# Patient Record
Sex: Male | Born: 1995 | Race: White | Hispanic: No | Marital: Single | Smoking: Never smoker
Health system: Southern US, Community
[De-identification: ages and names within clinical notes are randomized; demographics above are authoritative.]

## PROBLEM LIST (undated history)

## (undated) DIAGNOSIS — IMO0002 Reserved for concepts with insufficient information to code with codable children: Secondary | ICD-10-CM

## (undated) DIAGNOSIS — R202 Paresthesia of skin: Secondary | ICD-10-CM

## (undated) DIAGNOSIS — G5603 Carpal tunnel syndrome, bilateral upper limbs: Secondary | ICD-10-CM

## (undated) DIAGNOSIS — E1065 Type 1 diabetes mellitus with hyperglycemia: Secondary | ICD-10-CM

## (undated) HISTORY — DX: Type 1 diabetes mellitus with hyperglycemia: E10.65

## (undated) HISTORY — DX: Paresthesia of skin: R20.2

## (undated) HISTORY — DX: Carpal tunnel syndrome, bilateral upper limbs: G56.03

## (undated) HISTORY — DX: Reserved for concepts with insufficient information to code with codable children: IMO0002

---
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
## 2017-03-01 DIAGNOSIS — Z794 Long term (current) use of insulin: Secondary | ICD-10-CM | POA: Diagnosis not present

## 2017-03-01 DIAGNOSIS — Z9641 Presence of insulin pump (external) (internal): Secondary | ICD-10-CM | POA: Diagnosis not present

## 2017-03-01 DIAGNOSIS — E109 Type 1 diabetes mellitus without complications: Secondary | ICD-10-CM | POA: Diagnosis not present

## 2017-03-17 ENCOUNTER — Other Ambulatory Visit: Payer: Self-pay | Admitting: Endocrinology

## 2017-03-17 NOTE — Telephone Encounter (Signed)
Please refill x 1 Ov is due  

## 2017-03-22 DIAGNOSIS — E109 Type 1 diabetes mellitus without complications: Secondary | ICD-10-CM | POA: Diagnosis not present

## 2017-04-19 DIAGNOSIS — E109 Type 1 diabetes mellitus without complications: Secondary | ICD-10-CM | POA: Diagnosis not present

## 2017-04-19 DIAGNOSIS — Z9641 Presence of insulin pump (external) (internal): Secondary | ICD-10-CM | POA: Diagnosis not present

## 2017-04-19 DIAGNOSIS — Z794 Long term (current) use of insulin: Secondary | ICD-10-CM | POA: Diagnosis not present

## 2017-05-30 DIAGNOSIS — Z9641 Presence of insulin pump (external) (internal): Secondary | ICD-10-CM | POA: Diagnosis not present

## 2017-05-30 DIAGNOSIS — Z794 Long term (current) use of insulin: Secondary | ICD-10-CM | POA: Diagnosis not present

## 2017-05-30 DIAGNOSIS — E109 Type 1 diabetes mellitus without complications: Secondary | ICD-10-CM | POA: Diagnosis not present

## 2017-07-21 DIAGNOSIS — E109 Type 1 diabetes mellitus without complications: Secondary | ICD-10-CM | POA: Diagnosis not present

## 2017-07-21 DIAGNOSIS — Z794 Long term (current) use of insulin: Secondary | ICD-10-CM | POA: Diagnosis not present

## 2017-07-21 DIAGNOSIS — Z9641 Presence of insulin pump (external) (internal): Secondary | ICD-10-CM | POA: Diagnosis not present

## 2017-08-01 ENCOUNTER — Encounter: Payer: Self-pay | Admitting: Endocrinology

## 2017-08-01 ENCOUNTER — Ambulatory Visit: Payer: BLUE CROSS/BLUE SHIELD | Admitting: Endocrinology

## 2017-08-01 VITALS — BP 122/78 | HR 99 | Wt 245.4 lb

## 2017-08-01 DIAGNOSIS — E1065 Type 1 diabetes mellitus with hyperglycemia: Secondary | ICD-10-CM | POA: Diagnosis not present

## 2017-08-01 LAB — POCT GLYCOSYLATED HEMOGLOBIN (HGB A1C): HEMOGLOBIN A1C: 7.3

## 2017-08-01 NOTE — Patient Instructions (Addendum)
check your blood sugar 6 times a day: before the 3 meals, and at bedtime.  also check if you have symptoms of your blood sugar being too high or too low.  please keep a record of the readings and bring it to your next appointment here (or you can bring the meter itself).  You can write it on any piece of paper.  please call us sooner if your blood sugar goes below 70, or if you have a lot of readings over 200. Please take these settings:  basal rate of 1.7 units/hr, midnight-6AM, and 3.5 units/hr all other times.  mealtime bolus (which you take only at supper): 1 unit/ 10 grams carbohydrate.  correction bolus (which some people call "sensitivity," or "insulin sensitivity ratio," or just "isr") of 1 unit for each 50 by which your glucose exceeds 100.  Suspend the pump x 1-2 hrs, for exercise.   For prolonged (> 2 hrs) exercise, reduce basal rate by 1/2.  Please come back for a follow-up appointment in 3 months.

## 2017-08-01 NOTE — Progress Notes (Signed)
Subjective:    Patient ID: Sean Fuentes, male    DOB: 09-08-1995, 22 y.o.   MRN: [id number]  HPI Pt returns for f/u of diabetes mellitus:  DM type: 1  Dx'ed: 2002  Complications: retinopathy.  Therapy: insulin since dx  DKA: 2010, which pt attributes to a pump malfunction.  Severe hypoglycemia: never Pancreatitis: never Other: pump rx since 2004; he now uses an omnipod.  He has a Dexcom G-5.  He goes to [facility]; therapy limited by noncompliance with cbg monitoring and taking boluses, so the plan has been to emphasize the basal rate Interval history:  He takes these settings:  basal rate of 1.7 units/hr, midnight-6AM, and 3.5 units/hr all other times.  mealtime bolus (which you take only at supper): 1 unit/ 10 grams carbohydrate.  correction bolus (which some people call "sensitivity," or "insulin sensitivity ratio," or just "isr") of 1 unit for each 50 by which your glucose exceeds 100.  Suspend the pump x 1-2 hrs, for exercise (however, he does not exercise recently).   For prolonged (> 2 hrs) exercise, reduces basal rate by 1/2.  Total daily dosage is approx 75 units He hasn't recently used continuous glucose monitor. he has hypoglycemia 1-2 times daily.  This happens at any time of day. It is highest at HS (200).  He says he eats less during the summer, when he is not at school.  Meter is downloaded today, and the printout is scanned into the record.  There aren't enough cbg's to draw any conclusions about trends. Past Medical History:  Diagnosis Date  . Bilateral carpal tunnel syndrome 01/15/2016  . Paresthesia 12/25/2015  . Type I (juvenile type) diabetes mellitus without mention of complication, uncontrolled     History reviewed. No pertinent surgical history.  Social History   Socioeconomic History  . Marital status: Single    Spouse name: Not on file  . Number of children: 0  . Years of education: 42  . Highest education level: Not on file  Social Needs  . Financial  resource strain: Not on file  . Food insecurity - worry: Not on file  . Food insecurity - inability: Not on file  . Transportation needs - medical: Not on file  . Transportation needs - non-medical: Not on file  Occupational History  . Occupation: Restaurant  Tobacco Use  . Smoking status: Never Smoker  . Smokeless tobacco: Never Used  Substance and Sexual Activity  . Alcohol use: No  . Drug use: No  . Sexual activity: Not on file  Other Topics Concern  . Not on file  Social History Narrative   Lives at home w/ mom and sister   Right-handed   Reports 600 mg caffeine per day    Current Outpatient Medications on File Prior to Visit  Medication Sig Dispense Refill  . glucagon (GLUCAGON EMERGENCY) 1 MG injection Inject 1 mg into the vein once as needed. 4 each 2  . Glucose Blood (BLOOD GLUCOSE TEST STRIPS) STRP Use to check blood sugar 6 times per day. Please verify brand of test strips with pt. 540 each 2  . insulin aspart (NOVOLOG FLEXPEN) 100 UNIT/ML FlexPen 1 unit per 15 gms of carbs, 3 times a day (just before each meal) 15 mL 11  . Insulin Disposable Pump (OMNIPOD) MISC 1 Device by Does not apply route every 3 (three) days.    . Insulin Glargine (LANTUS SOLOSTAR) 100 UNIT/ML Solostar Pen Inject 70 Units into the skin every  morning. And pen needles 4/day 10 pen PRN  . NOVOLOG 100 UNIT/ML injection FOR USE IN PUMP, A TOTAL OF 100 UNITS/DAY. 110 mL 0   Current Facility-Administered Medications on File Prior to Visit  Medication Dose Route Frequency Provider Last Rate Last Dose  . gadopentetate dimeglumine (MAGNEVIST) injection 20 mL  20 mL Intravenous Once PRN York SpanielWillis, Charles K, MD        Not on File  Family History  Problem Relation Age of Onset  . Hypothyroidism Father   . Hypertension Father   . High Cholesterol Father   . Seizures Father   . Hypothyroidism Mother   . Diabetes Neg Hx     BP 122/78 (BP Location: Left Arm, Patient Position: Sitting, Cuff Size: Normal)    Pulse 99   Wt 245 lb 6.4 oz (111.3 kg)   SpO2 97%   BMI 33.28 kg/m    Review of Systems Denies LOC    Objective:   Physical Exam VITAL SIGNS:  See vs page GENERAL: no distress Pulses: foot pulses are intact bilaterally.   MSK: no deformity of the feet or ankles.  CV: no edema of the legs or ankles Skin:  no ulcer on the feet or ankles.  normal color and temp on the feet and ankles Neuro: sensation is intact to touch on the feet and ankles.    A1c=7.3%     Assessment & Plan:  Type 1 DM: worse. Noncompliance with cbg recording: there aren't enough cbg's to draw conclusions.  Hypoglycemia: this limits aggressiveness of glycemic control.   Patient Instructions  check your blood sugar 6 times a day: before the 3 meals, and at bedtime.  also check if you have symptoms of your blood sugar being too high or too low.  please keep a record of the readings and bring it to your next appointment here (or you can bring the meter itself).  You can write it on any piece of paper.  please call us sooner if your blood sugar goes below 70, or if you have a lot of readings over 200. Please take these settings:  basal rate of 1.7 units/hr, midnight-6AM, and 3.5 units/hr all other times.  mealtime bolus (which you take only at supper): 1 unit/ 10 grams carbohydrate.  correction bolus (which some people call "sensitivity," or "insulin sensitivity ratio," or just "isr") of 1 unit for each 50 by which your glucose exceeds 100.  Suspend the pump x 1-2 hrs, for exercise.   For prolonged (> 2 hrs) exercise, reduce basal rate by 1/2.  Please come back for a follow-up appointment in 3 months.

## 2017-08-04 ENCOUNTER — Telehealth: Payer: Self-pay | Admitting: Endocrinology

## 2017-08-04 ENCOUNTER — Other Ambulatory Visit: Payer: Self-pay

## 2017-08-04 MED ORDER — INSULIN ASPART 100 UNIT/ML ~~LOC~~ SOLN: SUBCUTANEOUS | 0 refills | Status: DC

## 2017-08-04 NOTE — Telephone Encounter (Signed)
I have sent to pharmacy requested.

## 2017-08-04 NOTE — Telephone Encounter (Signed)
Pt called in for a refill of the NOVOLOG vials for pump use. He originally called the pharmacy to have it transferred to new pharmacy and get his refill. He was told that it couldn't be transferred because there where no more refills. Pt would like the refill sent to the new pharmacy listed below.  ([facility]: [address], [place], [place])

## 2017-10-29 ENCOUNTER — Other Ambulatory Visit: Payer: Self-pay | Admitting: Endocrinology

## 2017-11-10 DIAGNOSIS — Z23 Encounter for immunization: Secondary | ICD-10-CM | POA: Diagnosis not present

## 2018-01-16 DIAGNOSIS — E109 Type 1 diabetes mellitus without complications: Secondary | ICD-10-CM | POA: Diagnosis not present

## 2018-01-16 DIAGNOSIS — Z9641 Presence of insulin pump (external) (internal): Secondary | ICD-10-CM | POA: Diagnosis not present

## 2018-01-16 DIAGNOSIS — Z794 Long term (current) use of insulin: Secondary | ICD-10-CM | POA: Diagnosis not present

## 2018-02-26 ENCOUNTER — Other Ambulatory Visit: Payer: Self-pay | Admitting: Endocrinology

## 2018-02-26 NOTE — Telephone Encounter (Signed)
Last ov 08/01/17 ok to refill?

## 2018-02-26 NOTE — Telephone Encounter (Signed)
Please refill x 1 Ov is due  

## 2018-03-13 ENCOUNTER — Encounter: Payer: Self-pay | Admitting: Endocrinology

## 2018-03-13 ENCOUNTER — Ambulatory Visit: Payer: BLUE CROSS/BLUE SHIELD | Admitting: Endocrinology

## 2018-03-13 VITALS — BP 126/80 | HR 80 | Ht 72.0 in | Wt 248.2 lb

## 2018-03-13 DIAGNOSIS — Z23 Encounter for immunization: Secondary | ICD-10-CM | POA: Diagnosis not present

## 2018-03-13 DIAGNOSIS — E1065 Type 1 diabetes mellitus with hyperglycemia: Secondary | ICD-10-CM

## 2018-03-13 LAB — POCT GLYCOSYLATED HEMOGLOBIN (HGB A1C): HEMOGLOBIN A1C: 7.9 % — AB (ref 4.0–5.6)

## 2018-03-13 NOTE — Patient Instructions (Addendum)
check your blood sugar 6 times a day: before the 3 meals, and at bedtime.  also check if you have symptoms of your blood sugar being too high or too low.  please keep a record of the readings and bring it to your next appointment here (or you can bring the meter itself).  You can write it on any piece of paper.  please call us sooner if your blood sugar goes below 70, or if you have a lot of readings over 200. Please take these settings:  basal rate of 1.6 units/hr, midnight-6AM, and 3.6 units/hr all other times.  mealtime bolus (which you take only at supper): 1 unit/ 10 grams carbohydrate.  correction bolus (which some people call "sensitivity," or "insulin sensitivity ratio," or just "isr") of 1 unit for each 50 by which your glucose exceeds 100.  Suspend the pump x 1-2 hrs, for exercise.   For prolonged (> 2 hrs) exercise, reduce basal rate by 1/2.  Please come back for a follow-up appointment in 2 months.

## 2018-03-13 NOTE — Progress Notes (Signed)
Subjective:    Patient ID: Sean Fuentes, male    DOB: [birth year], 22 y.o.   MRN: [id number]  HPI Pt returns for f/u of diabetes mellitus:  DM type: 1  Dx'ed: 2002  Complications: retinopathy.  Therapy: insulin since dx  DKA: 2010, which pt attributes to a pump malfunction.  Severe hypoglycemia: never Pancreatitis: never Other: pump rx since 2004; he now uses an omnipod, and Dexcom G-5.  He goes to [facility]; therapy limited by missing cbg monitoring and boluses, so the plan has been to emphasize the basal rate.   Interval history:  He takes these settings:  Pt says he takes just 1 bolus per day.  He takes these settings: basal rate of 1.7 units/hr, midnight-6AM, and 3.5 units/hr all other times.  mealtime bolus (which you take only at supper): 1 unit/ 10 grams carbohydrate.  correction bolus (which some people call "sensitivity," or "insulin sensitivity ratio," or just "isr") of 1 unit for each 50 by which your glucose exceeds 100.  Suspend the pump x 1-2 hrs, for exercise.   For prolonged (> 2 hrs) exercise, reduce basal rate by 1/2.   Total daily dosage is approx 80 units.  He hasn't recently used continuous glucose monitor. he has hypoglycemia 1-2 times daily.  This happens at any time of day. It is highest at HS (200).  He says he eats less during the summer, when he is not at school.  Meter is downloaded today, and the printout is scanned into the record.  There aren't enough cbg's to draw any conclusions about trends. He hasn't recently used continuous glucose monitor.  This happens at any time of day. It is highest at HS (200).  He says he eats less during the summer, when he is not at school.  Meter is downloaded today, and the printout is scanned into the record.  All are checked 7PM-11PM.  It varies from 120-390.  He often has mild hypoglycemia--this happens in the middle of the night.   Past Medical History:  Diagnosis Date  . Bilateral carpal tunnel syndrome 01/15/2016  .  Paresthesia 12/25/2015  . Type I (juvenile type) diabetes mellitus without mention of complication, uncontrolled     No past surgical history on file.  Social History   Socioeconomic History  . Marital status: Single    Spouse name: Not on file  . Number of children: 0  . Years of education: 19  . Highest education level: Not on file  Occupational History  . Occupation: Higher education careers adviser  . Financial resource strain: Not on file  . Food insecurity:    Worry: Not on file    Inability: Not on file  . Transportation needs:    Medical: Not on file    Non-medical: Not on file  Tobacco Use  . Smoking status: Never Smoker  . Smokeless tobacco: Never Used  Substance and Sexual Activity  . Alcohol use: No  . Drug use: No  . Sexual activity: Not on file  Lifestyle  . Physical activity:    Days per week: Not on file    Minutes per session: Not on file  . Stress: Not on file  Relationships  . Social connections:    Talks on phone: Not on file    Gets together: Not on file    Attends religious service: Not on file    Active member of club or organization: Not on file    Attends meetings of clubs  or organizations: Not on file    Relationship status: Not on file  . Intimate partner violence:    Fear of current or ex partner: Not on file    Emotionally abused: Not on file    Physically abused: Not on file    Forced sexual activity: Not on file  Other Topics Concern  . Not on file  Social History Narrative   Lives at home w/ mom and sister   Right-handed   Reports 600 mg caffeine per day    Current Outpatient Medications on File Prior to Visit  Medication Sig Dispense Refill  . glucagon (GLUCAGON EMERGENCY) 1 MG injection Inject 1 mg into the vein once as needed. 4 each 2  . Glucose Blood (BLOOD GLUCOSE TEST STRIPS) STRP Use to check blood sugar 6 times per day. Please verify brand of test strips with pt. 540 each 2  . insulin aspart (NOVOLOG FLEXPEN) 100 UNIT/ML  FlexPen 1 unit per 15 gms of carbs, 3 times a day (just before each meal) 15 mL 11  . insulin aspart (NOVOLOG) 100 UNIT/ML injection 1u per 15G of carbs tid ac 15 mL 0  . Insulin Disposable Pump (OMNIPOD) MISC 1 Device by Does not apply route every 3 (three) days.    . Insulin Glargine (LANTUS SOLOSTAR) 100 UNIT/ML Solostar Pen Inject 70 Units into the skin every morning. And pen needles 4/day 10 pen PRN   Current Facility-Administered Medications on File Prior to Visit  Medication Dose Route Frequency Provider Last Rate Last Dose  . gadopentetate dimeglumine (MAGNEVIST) injection 20 mL  20 mL Intravenous Once PRN York Spaniel, MD        No Known Allergies  Family History  Problem Relation Age of Onset  . Hypothyroidism Father   . Hypertension Father   . High Cholesterol Father   . Seizures Father   . Hypothyroidism Mother   . Diabetes Neg Hx     BP 126/80   Pulse 80   Ht 6' (1.829 m)   Wt 248 lb 3.2 oz (112.6 kg)   SpO2 98%   BMI 33.66 kg/m    Review of Systems Denies LOC    Objective:   Physical Exam VITAL SIGNS:  See vs page GENERAL: no distress Pulses: foot pulses are intact bilaterally.   MSK: no deformity of the feet or ankles.  CV: no edema of the legs or ankles Skin:  no ulcer on the feet or ankles.  normal color and temp on the feet and ankles Neuro: sensation is intact to touch on the feet and ankles.      Lab Results  Component Value Date   HGBA1C 7.9 (A) 03/13/2018       Assessment & Plan:  Type 1 DM: he needs increased rx   Patient Instructions  check your blood sugar 6 times a day: before the 3 meals, and at bedtime.  also check if you have symptoms of your blood sugar being too high or too low.  please keep a record of the readings and bring it to your next appointment here (or you can bring the meter itself).  You can write it on any piece of paper.  please call us sooner if your blood sugar goes below 70, or if you have a lot of readings  over 200. Please take these settings:  basal rate of 1.6 units/hr, midnight-6AM, and 3.6 units/hr all other times.  mealtime bolus (which you take only at supper): 1  unit/ 10 grams carbohydrate.  correction bolus (which some people call "sensitivity," or "insulin sensitivity ratio," or just "isr") of 1 unit for each 50 by which your glucose exceeds 100.  Suspend the pump x 1-2 hrs, for exercise.   For prolonged (> 2 hrs) exercise, reduce basal rate by 1/2.  Please come back for a follow-up appointment in 2 months.

## 2018-03-16 ENCOUNTER — Other Ambulatory Visit: Payer: Self-pay | Admitting: Endocrinology

## 2018-03-30 ENCOUNTER — Other Ambulatory Visit: Payer: Self-pay | Admitting: Endocrinology

## 2018-04-02 DIAGNOSIS — Z9641 Presence of insulin pump (external) (internal): Secondary | ICD-10-CM | POA: Diagnosis not present

## 2018-04-02 DIAGNOSIS — Z794 Long term (current) use of insulin: Secondary | ICD-10-CM | POA: Diagnosis not present

## 2018-04-02 DIAGNOSIS — E109 Type 1 diabetes mellitus without complications: Secondary | ICD-10-CM | POA: Diagnosis not present

## 2018-04-24 DIAGNOSIS — Z794 Long term (current) use of insulin: Secondary | ICD-10-CM | POA: Diagnosis not present

## 2018-04-24 DIAGNOSIS — Z9641 Presence of insulin pump (external) (internal): Secondary | ICD-10-CM | POA: Diagnosis not present

## 2018-04-24 DIAGNOSIS — E109 Type 1 diabetes mellitus without complications: Secondary | ICD-10-CM | POA: Diagnosis not present

## 2018-05-28 ENCOUNTER — Encounter: Payer: Self-pay | Admitting: Endocrinology

## 2018-05-28 ENCOUNTER — Ambulatory Visit: Payer: BLUE CROSS/BLUE SHIELD | Admitting: Endocrinology

## 2018-05-28 VITALS — BP 126/60 | HR 88 | Ht 72.0 in | Wt 257.0 lb

## 2018-05-28 DIAGNOSIS — E1065 Type 1 diabetes mellitus with hyperglycemia: Secondary | ICD-10-CM

## 2018-05-28 LAB — POCT GLYCOSYLATED HEMOGLOBIN (HGB A1C): Hemoglobin A1C: 7.1 % — AB (ref 4.0–5.6)

## 2018-05-28 NOTE — Patient Instructions (Addendum)
check your blood sugar 6 times a day: before the 3 meals, and at bedtime.  also check if you have symptoms of your blood sugar being too high or too low.  please keep a record of the readings and bring it to your next appointment here (or you can bring the meter itself).  You can write it on any piece of paper.  please call us sooner if your blood sugar goes below 70, or if you have a lot of readings over 200. Please take these settings:  basal rate of 1.6 units/hr, midnight-6AM, and 3.6 units/hr all other times.  mealtime bolus (which you take only at supper): 1 unit/ 10 grams carbohydrate.   correction bolus (which some people call "sensitivity," or "insulin sensitivity ratio," or just "isr") of 1 unit for each 50 by which your glucose exceeds 100.  Suspend the pump x 1-2 hrs, for exercise.   For prolonged (> 2 hrs) exercise, reduce basal rate by 1/2.  Please come back for a follow-up appointment in 3 months.       

## 2018-05-28 NOTE — Progress Notes (Signed)
Subjective:    Patient ID: Sean Fuentes, Sean Fuentes    DOB: 1996/03/19, 22 y.o.   MRN: [id number]  HPI Pt returns for f/u of diabetes mellitus:  DM type: 1  Dx'ed: 2002  Complications: retinopathy.  Therapy: insulin since dx  DKA: 2010, which pt attributes to a pump malfunction.  Severe hypoglycemia: never Pancreatitis: never Other: pump rx since 2004; he now uses an omnipod pump.  He stopped using his Dexcom G-5.  He goes to [facility]; therapy limited by missing cbg monitoring and boluses, so the plan has been to emphasize the basal rate.   Interval history:  He takes these settings:  basal rate of 1.6 units/hr, midnight-6AM, and 3.6 units/hr all other times.  mealtime bolus (which you take only at supper): 1 unit/ 10 grams carbohydrate.  correction bolus (which some people call "sensitivity," or "insulin sensitivity ratio," or just "isr") of 1 unit for each 50 by which your glucose exceeds 100.  Suspends the pump x 1-2 hrs, for exercise.   For prolonged (> 2 hrs) exercise, reduces basal rate by 1/2.  Total daily dosage is approx 100 units.  He has mild hypoglycemia approx QOD--this happens in the late morning no cbg record, but states cbg's are highest at HS.  Pt says he takes just 1-2 boluses per day.  Past Medical History:  Diagnosis Date  . Bilateral carpal tunnel syndrome 01/15/2016  . Paresthesia 12/25/2015  . Type I (juvenile type) diabetes mellitus without mention of complication, uncontrolled     No past surgical history on file.  Social History   Socioeconomic History  . Marital status: Single    Spouse name: Not on file  . Number of children: 0  . Years of education: 2112  . Highest education level: Not on file  Occupational History  . Occupation: Higher education careers adviserestaurant  Social Needs  . Financial resource strain: Not on file  . Food insecurity:    Worry: Not on file    Inability: Not on file  . Transportation needs:    Medical: Not on file    Non-medical: Not on file    Tobacco Use  . Smoking status: Never Smoker  . Smokeless tobacco: Never Used  Substance and Sexual Activity  . Alcohol use: No  . Drug use: No  . Sexual activity: Not on file  Lifestyle  . Physical activity:    Days per week: Not on file    Minutes per session: Not on file  . Stress: Not on file  Relationships  . Social connections:    Talks on phone: Not on file    Gets together: Not on file    Attends religious service: Not on file    Active member of club or organization: Not on file    Attends meetings of clubs or organizations: Not on file    Relationship status: Not on file  . Intimate partner violence:    Fear of current or ex partner: Not on file    Emotionally abused: Not on file    Physically abused: Not on file    Forced sexual activity: Not on file  Other Topics Concern  . Not on file  Social History Narrative   Lives at home w/ mom and sister   Right-handed   Reports 600 mg caffeine per day    Current Outpatient Medications on File Prior to Visit  Medication Sig Dispense Refill  . glucagon (GLUCAGON EMERGENCY) 1 MG injection Inject 1 mg into the  vein once as needed. 4 each 2  . Glucose Blood (BLOOD GLUCOSE TEST STRIPS) STRP Use to check blood sugar 6 times per day. Please verify brand of test strips with pt. 540 each 2  . insulin aspart (NOVOLOG) 100 UNIT/ML injection FOR USE IN PUMP, A TOTAL OF 100 UNITS/DAY. 90 mL 0  . insulin aspart (NOVOLOG) 100 UNIT/ML injection INJECT 1 UNIT PER 15 GRAMS OF CARBS 3 TIMES DAILY AFTER MEALS 10 mL 0  . Insulin Disposable Pump (OMNIPOD) MISC 1 Device by Does not apply route every 3 (three) days.    . Insulin Glargine (LANTUS SOLOSTAR) 100 UNIT/ML Solostar Pen Inject 70 Units into the skin every morning. And pen needles 4/day 10 pen PRN   Current Facility-Administered Medications on File Prior to Visit  Medication Dose Route Frequency Provider Last Rate Last Dose  . gadopentetate dimeglumine (MAGNEVIST) injection 20 mL  20  mL Intravenous Once PRN York Spaniel, MD        No Known Allergies  Family History  Problem Relation Age of Onset  . Hypothyroidism Father   . Hypertension Father   . High Cholesterol Father   . Seizures Father   . Hypothyroidism Mother   . Diabetes Neg Hx     BP 126/60   Pulse 88   Ht 6' (1.829 m)   Wt 257 lb (116.6 kg)   SpO2 98%   BMI 34.86 kg/m   Review of Systems Denies LOC    Objective:   Physical Exam VITAL SIGNS:  See vs page GENERAL: no distress Pulses: dorsalis pedis intact bilat.   MSK: no deformity of the feet CV: no leg edema Skin:  no ulcer on the feet.  normal color and temp on the feet. Neuro: sensation is intact to touch on the feet.    A1c=7.1%     Assessment & Plan:  Type 1 DM: well-controlled.  Hypoglycemia: this limits agressiveness of glycemic control.   Patient Instructions  check your blood sugar 6 times a day: before the 3 meals, and at bedtime.  also check if you have symptoms of your blood sugar being too high or too low.  please keep a record of the readings and bring it to your next appointment here (or you can bring the meter itself).  You can write it on any piece of paper.  please call us sooner if your blood sugar goes below 70, or if you have a lot of readings over 200. Please take these settings:  basal rate of 1.6 units/hr, midnight-6AM, and 3.6 units/hr all other times.  mealtime bolus (which you take only at supper): 1 unit/ 10 grams carbohydrate.  correction bolus (which some people call "sensitivity," or "insulin sensitivity ratio," or just "isr") of 1 unit for each 50 by which your glucose exceeds 100.  Suspend the pump x 1-2 hrs, for exercise.   For prolonged (> 2 hrs) exercise, reduce basal rate by 1/2.  Please come back for a follow-up appointment in 3 months.

## 2018-06-17 ENCOUNTER — Other Ambulatory Visit: Payer: Self-pay | Admitting: Endocrinology

## 2018-07-26 DIAGNOSIS — E109 Type 1 diabetes mellitus without complications: Secondary | ICD-10-CM | POA: Diagnosis not present

## 2018-07-26 DIAGNOSIS — Z9641 Presence of insulin pump (external) (internal): Secondary | ICD-10-CM | POA: Diagnosis not present

## 2018-07-26 DIAGNOSIS — Z794 Long term (current) use of insulin: Secondary | ICD-10-CM | POA: Diagnosis not present

## 2018-09-05 ENCOUNTER — Telehealth: Payer: Self-pay | Admitting: Endocrinology

## 2018-09-05 NOTE — Telephone Encounter (Signed)
Patient's refill for his Novolog insulin was written so that he only got 1 vial of insulin versus 9 vials as per norm.  Requests that his Rx be written in order for him to get a months worth of insulin -

## 2018-09-06 ENCOUNTER — Other Ambulatory Visit: Payer: Self-pay | Admitting: Endocrinology

## 2018-09-06 MED ORDER — INSULIN ASPART 100 UNIT/ML ~~LOC~~ SOLN: SUBCUTANEOUS | 0 refills | Status: DC

## 2018-09-06 NOTE — Telephone Encounter (Signed)
Refilled Novolog insulin #9 vials sent to the pharmacy

## 2018-10-10 DIAGNOSIS — S61412A Laceration without foreign body of left hand, initial encounter: Secondary | ICD-10-CM | POA: Diagnosis not present

## 2018-10-10 DIAGNOSIS — Z9641 Presence of insulin pump (external) (internal): Secondary | ICD-10-CM | POA: Diagnosis not present

## 2018-10-10 DIAGNOSIS — Z794 Long term (current) use of insulin: Secondary | ICD-10-CM | POA: Diagnosis not present

## 2018-10-10 DIAGNOSIS — F419 Anxiety disorder, unspecified: Secondary | ICD-10-CM | POA: Diagnosis not present

## 2018-10-10 DIAGNOSIS — Z79899 Other long term (current) drug therapy: Secondary | ICD-10-CM | POA: Diagnosis not present

## 2018-10-10 DIAGNOSIS — E109 Type 1 diabetes mellitus without complications: Secondary | ICD-10-CM | POA: Diagnosis not present

## 2018-10-10 DIAGNOSIS — G8911 Acute pain due to trauma: Secondary | ICD-10-CM | POA: Diagnosis not present

## 2018-10-10 DIAGNOSIS — S61012A Laceration without foreign body of left thumb without damage to nail, initial encounter: Secondary | ICD-10-CM | POA: Diagnosis not present

## 2018-10-19 DIAGNOSIS — Z4802 Encounter for removal of sutures: Secondary | ICD-10-CM | POA: Diagnosis not present

## 2018-12-03 ENCOUNTER — Other Ambulatory Visit: Payer: Self-pay | Admitting: Endocrinology

## 2018-12-17 DIAGNOSIS — E109 Type 1 diabetes mellitus without complications: Secondary | ICD-10-CM | POA: Diagnosis not present

## 2018-12-17 DIAGNOSIS — Z794 Long term (current) use of insulin: Secondary | ICD-10-CM | POA: Diagnosis not present

## 2018-12-17 DIAGNOSIS — Z9641 Presence of insulin pump (external) (internal): Secondary | ICD-10-CM | POA: Diagnosis not present

## 2018-12-17 DIAGNOSIS — F419 Anxiety disorder, unspecified: Secondary | ICD-10-CM | POA: Diagnosis not present

## 2018-12-17 DIAGNOSIS — F329 Major depressive disorder, single episode, unspecified: Secondary | ICD-10-CM | POA: Diagnosis not present

## 2018-12-17 DIAGNOSIS — L309 Dermatitis, unspecified: Secondary | ICD-10-CM | POA: Diagnosis not present

## 2018-12-17 DIAGNOSIS — L03116 Cellulitis of left lower limb: Secondary | ICD-10-CM | POA: Diagnosis not present

## 2018-12-17 DIAGNOSIS — L03115 Cellulitis of right lower limb: Secondary | ICD-10-CM | POA: Diagnosis not present

## 2019-01-29 DIAGNOSIS — Z9641 Presence of insulin pump (external) (internal): Secondary | ICD-10-CM | POA: Diagnosis not present

## 2019-01-29 DIAGNOSIS — E109 Type 1 diabetes mellitus without complications: Secondary | ICD-10-CM | POA: Diagnosis not present

## 2019-01-29 DIAGNOSIS — Z794 Long term (current) use of insulin: Secondary | ICD-10-CM | POA: Diagnosis not present

## 2019-01-31 ENCOUNTER — Encounter: Payer: Self-pay | Admitting: Endocrinology

## 2019-01-31 NOTE — Telephone Encounter (Signed)
Please advise 

## 2019-02-05 ENCOUNTER — Other Ambulatory Visit: Payer: Self-pay

## 2019-02-07 ENCOUNTER — Other Ambulatory Visit: Payer: Self-pay

## 2019-02-07 ENCOUNTER — Ambulatory Visit (INDEPENDENT_AMBULATORY_CARE_PROVIDER_SITE_OTHER): Payer: BC Managed Care – PPO | Admitting: Endocrinology

## 2019-02-07 ENCOUNTER — Encounter: Payer: Self-pay | Admitting: Endocrinology

## 2019-02-07 VITALS — BP 142/80 | HR 94 | Ht 72.0 in | Wt 255.2 lb

## 2019-02-07 DIAGNOSIS — E1065 Type 1 diabetes mellitus with hyperglycemia: Secondary | ICD-10-CM

## 2019-02-07 LAB — POCT GLYCOSYLATED HEMOGLOBIN (HGB A1C): Hemoglobin A1C: 7.1 % — AB (ref 4.0–5.6)

## 2019-02-07 MED ORDER — GLUCAGON EMERGENCY 1 MG IJ KIT: 1.0000 mg | PACK | Freq: Once | INTRAMUSCULAR | 2 refills | Status: DC | PRN

## 2019-02-07 MED ORDER — INSULIN ASPART 100 UNIT/ML ~~LOC~~ SOLN: SUBCUTANEOUS | 3 refills | Status: DC

## 2019-02-07 NOTE — Progress Notes (Signed)
Subjective:    Patient ID: Sean Fuentes, male    DOB: 07-15-1995, 23 y.o.   MRN: [id number]  HPI Pt returns for f/u of diabetes mellitus:  DM type: 1  Dx'ed: 9323  Complications: retinopathy.  Therapy: insulin since dx  DKA: 2010, which pt attributes to a pump malfunction.  Severe hypoglycemia: never Pancreatitis: never Other: pump rx since 2004; he now uses an omnipod pump.  He stopped using his Dexcom G-5, as he did not want to wear and additional device.  He goes to [facility]; therapy limited by missing cbg monitoring and boluses, so the plan has been to emphasize the basal rate.   Interval history:  He takes these settings:  basal rate of 1.6 units/hr, midnight-6AM, and 3.6 units/hr all other times.  mealtime bolus (which you take only at supper): 1 unit/ 10 grams carbohydrate.  correction bolus (which some people call "sensitivity," or "insulin sensitivity ratio," or just "isr") of 1 unit for each 50 by which your glucose exceeds 100.  Suspends the pump x 1-2 hrs, for exercise.   For prolonged (> 2 hrs) exercise, reduces basal rate by 1/2.  Total daily dosage is approx 80 units.  He has sxs of mild hypoglycemia approx QD--this happens in the late afternoon. This happens if he eats a minimal lunch.   Pt says he takes just 1 bolus per day.  Meter is downloaded today, and the printout is scanned into the record.  cbg varies from 211-309.  There is no trend throughout the day.  There are only 4 cbg's.   Past Medical History:  Diagnosis Date  . Bilateral carpal tunnel syndrome 01/15/2016  . Paresthesia 12/25/2015  . Type I (juvenile type) diabetes mellitus without mention of complication, uncontrolled     No past surgical history on file.  Social History   Socioeconomic History  . Marital status: Single    Spouse name: Not on file  . Number of children: 0  . Years of education: 65  . Highest education level: Not on file  Occupational History  . Occupation: Ship broker  . Financial resource strain: Not on file  . Food insecurity    Worry: Not on file    Inability: Not on file  . Transportation needs    Medical: Not on file    Non-medical: Not on file  Tobacco Use  . Smoking status: Never Smoker  . Smokeless tobacco: Never Used  Substance and Sexual Activity  . Alcohol use: No  . Drug use: No  . Sexual activity: Not on file  Lifestyle  . Physical activity    Days per week: Not on file    Minutes per session: Not on file  . Stress: Not on file  Relationships  . Social Herbalist on phone: Not on file    Gets together: Not on file    Attends religious service: Not on file    Active member of club or organization: Not on file    Attends meetings of clubs or organizations: Not on file    Relationship status: Not on file  . Intimate partner violence    Fear of current or ex partner: Not on file    Emotionally abused: Not on file    Physically abused: Not on file    Forced sexual activity: Not on file  Other Topics Concern  . Not on file  Social History Narrative   Lives at home w/  mom and sister   Right-handed   Reports 600 mg caffeine per day    Current Outpatient Medications on File Prior to Visit  Medication Sig Dispense Refill  . Glucose Blood (BLOOD GLUCOSE TEST STRIPS) STRP Use to check blood sugar 6 times per day. Please verify brand of test strips with pt. 540 each 2  . Insulin Disposable Pump (OMNIPOD) MISC 1 Device by Does not apply route every 3 (three) days.     Current Facility-Administered Medications on File Prior to Visit  Medication Dose Route Frequency Provider Last Rate Last Dose  . gadopentetate dimeglumine (MAGNEVIST) injection 20 mL  20 mL Intravenous Once PRN York SpanielWillis, Charles K, MD        No Known Allergies  Family History  Problem Relation Age of Onset  . Hypothyroidism Father   . Hypertension Father   . High Cholesterol Father   . Seizures Father   . Hypothyroidism Mother   .  Diabetes Neg Hx     BP (!) 142/80 (BP Location: Right Arm, Patient Position: Sitting, Cuff Size: Large)   Pulse 94   Ht 6' (1.829 m)   Wt 255 lb 3.2 oz (115.8 kg)   SpO2 96%   BMI 34.61 kg/m    Review of Systems Denies LOC.     Objective:   Physical Exam VITAL SIGNS:  See vs page GENERAL: no distress Pulses: dorsalis pedis intact bilat.   MSK: no deformity of the feet CV: no leg edema Skin:  no ulcer on the feet, but there are heavy calluses.  normal color and temp on the feet. Neuro: sensation is intact to touch on the feet   Lab Results  Component Value Date   HGBA1C 7.1 (A) 02/07/2019       Assessment & Plan:  Type 1 DM: well-controlled Hypoglycemia: this limits aggressiveness of glycemic control. Risk of infectious disease: I did letter for pt today.  HTN: is noted today.  Recheck next time   Patient Instructions  check your blood sugar 6 times a day: before the 3 meals, and at bedtime.  also check if you have symptoms of your blood sugar being too high or too low.  please keep a record of the readings and bring it to your next appointment here (or you can bring the meter itself).  You can write it on any piece of paper.  please call us sooner if your blood sugar goes below 70, or if you have a lot of readings over 200. Please take these settings:  basal rate of 1.6 units/hr, midnight-6AM, and 3.6 units/hr all other times.  mealtime bolus (which you take only at supper): 1 unit/ 10 grams carbohydrate.   correction bolus (which some people call "sensitivity," or "insulin sensitivity ratio," or just "isr") of 1 unit for each 50 by which your glucose exceeds 100.  Suspend the pump x 1-2 hrs, for exercise.   For prolonged (> 2 hrs) exercise, reduce basal rate by 1/2.  Please come back for a follow-up appointment in 3 months.

## 2019-02-07 NOTE — Patient Instructions (Addendum)
check your blood sugar 6 times a day: before the 3 meals, and at bedtime.  also check if you have symptoms of your blood sugar being too high or too low.  please keep a record of the readings and bring it to your next appointment here (or you can bring the meter itself).  You can write it on any piece of paper.  please call us sooner if your blood sugar goes below 70, or if you have a lot of readings over 200. Please take these settings:  basal rate of 1.6 units/hr, midnight-6AM, and 3.6 units/hr all other times.  mealtime bolus (which you take only at supper): 1 unit/ 10 grams carbohydrate.   correction bolus (which some people call "sensitivity," or "insulin sensitivity ratio," or just "isr") of 1 unit for each 50 by which your glucose exceeds 100.  Suspend the pump x 1-2 hrs, for exercise.   For prolonged (> 2 hrs) exercise, reduce basal rate by 1/2.  Please come back for a follow-up appointment in 3 months.

## 2019-03-16 DIAGNOSIS — Z794 Long term (current) use of insulin: Secondary | ICD-10-CM | POA: Diagnosis not present

## 2019-03-16 DIAGNOSIS — E109 Type 1 diabetes mellitus without complications: Secondary | ICD-10-CM | POA: Diagnosis not present

## 2019-03-16 DIAGNOSIS — M6282 Rhabdomyolysis: Secondary | ICD-10-CM | POA: Diagnosis not present

## 2019-03-16 DIAGNOSIS — R7989 Other specified abnormal findings of blood chemistry: Secondary | ICD-10-CM | POA: Diagnosis not present

## 2019-03-16 DIAGNOSIS — R945 Abnormal results of liver function studies: Secondary | ICD-10-CM | POA: Diagnosis not present

## 2019-03-19 DIAGNOSIS — R748 Abnormal levels of other serum enzymes: Secondary | ICD-10-CM | POA: Diagnosis not present

## 2019-03-19 DIAGNOSIS — M6282 Rhabdomyolysis: Secondary | ICD-10-CM | POA: Diagnosis not present

## 2019-03-21 DIAGNOSIS — R748 Abnormal levels of other serum enzymes: Secondary | ICD-10-CM | POA: Diagnosis not present

## 2019-03-21 DIAGNOSIS — M6282 Rhabdomyolysis: Secondary | ICD-10-CM | POA: Diagnosis not present

## 2019-03-26 DIAGNOSIS — R748 Abnormal levels of other serum enzymes: Secondary | ICD-10-CM | POA: Diagnosis not present

## 2019-03-26 DIAGNOSIS — T796XXD Traumatic ischemia of muscle, subsequent encounter: Secondary | ICD-10-CM | POA: Diagnosis not present

## 2019-05-31 DIAGNOSIS — Z20828 Contact with and (suspected) exposure to other viral communicable diseases: Secondary | ICD-10-CM | POA: Diagnosis not present

## 2019-06-04 DIAGNOSIS — Z9641 Presence of insulin pump (external) (internal): Secondary | ICD-10-CM | POA: Diagnosis not present

## 2019-06-04 DIAGNOSIS — Z794 Long term (current) use of insulin: Secondary | ICD-10-CM | POA: Diagnosis not present

## 2019-06-04 DIAGNOSIS — E109 Type 1 diabetes mellitus without complications: Secondary | ICD-10-CM | POA: Diagnosis not present

## 2019-08-15 ENCOUNTER — Telehealth: Payer: Self-pay

## 2019-08-15 NOTE — Telephone Encounter (Signed)
Received notification from [employer] stating Rx would need to be completed for pt to TRANSITION to [facility] system. Per Dr. Everardo All, unable to complete Rx forms without an appt. LVM requesting returned call.

## 2019-08-16 ENCOUNTER — Other Ambulatory Visit: Payer: Self-pay

## 2019-08-16 NOTE — Telephone Encounter (Signed)
Patient is scheduled for appointment on 08/19/19

## 2019-08-16 NOTE — Telephone Encounter (Signed)
[employer] documents have been placed on Dr. George Hugh desk for him to address request to TRANSITION to [facility] dash system during 08/19/19 appts.

## 2019-08-19 ENCOUNTER — Ambulatory Visit: Payer: BC Managed Care – PPO | Admitting: Endocrinology

## 2019-08-19 ENCOUNTER — Other Ambulatory Visit: Payer: Self-pay

## 2019-08-19 ENCOUNTER — Encounter: Payer: Self-pay | Admitting: Endocrinology

## 2019-08-19 ENCOUNTER — Telehealth: Payer: Self-pay

## 2019-08-19 VITALS — BP 126/70 | HR 94 | Ht 72.0 in | Wt 269.0 lb

## 2019-08-19 DIAGNOSIS — E1065 Type 1 diabetes mellitus with hyperglycemia: Secondary | ICD-10-CM | POA: Diagnosis not present

## 2019-08-19 LAB — POCT GLYCOSYLATED HEMOGLOBIN (HGB A1C): Hemoglobin A1C: 7.2 % — AB (ref 4.0–5.6)

## 2019-08-19 MED ORDER — OMNIPOD DASH PDM (GEN 4) KIT: 1.0000 | PACK | Freq: Once | 0 refills | Status: AC

## 2019-08-19 MED ORDER — GVOKE HYPOPEN 2-PACK 1 MG/0.2ML ~~LOC~~ SOAJ: 1.0000 | Freq: Once | SUBCUTANEOUS | 3 refills | Status: AC

## 2019-08-19 MED ORDER — OMNIPOD DASH PODS (GEN 4) MISC: 1.0000 | 3 refills | Status: DC

## 2019-08-19 NOTE — Patient Instructions (Addendum)
check your blood sugar 6 times a day: before the 3 meals, and at bedtime.  also check if you have symptoms of your blood sugar being too high or too low.  please keep a record of the readings and bring it to your next appointment here (or you can bring the meter itself).  You can write it on any piece of paper.  please call us sooner if your blood sugar goes below 70, or if you have a lot of readings over 200. Please take these settings:  basal rate of 1.6 units/hr, 10 PM-6AM, and 3.7 units/hr all other times.  mealtime bolus (which you take only at supper): 1 unit/ 10 grams carbohydrate.   correction bolus (which some people call "sensitivity," or "insulin sensitivity ratio," or just "isr") of 1 unit for each 50 by which your glucose exceeds 100.  Suspend the pump x 1-2 hrs, for exercise.   For prolonged (> 2 hrs) exercise, reduce basal rate by 1/2.  On this type of insulin schedule, you should eat meals on a regular schedule.  If a meal is missed or significantly delayed, your blood sugar could go low.   Please come back for a follow-up appointment in 3 months.

## 2019-08-19 NOTE — Telephone Encounter (Signed)
FAXED DOCUMENTS  Company: [employer]  Document: Rx for [employer] Other records requested: None requested  All above requested information has been faxed successfully to [employer] listed above. Documents and fax confirmation have been placed in the faxed file for future reference.

## 2019-08-19 NOTE — Progress Notes (Signed)
Subjective:    Patient ID: Sean Fuentes, male    DOB: [birth year], 24 y.o.   MRN: [id number]  HPI Pt returns for f/u of diabetes mellitus:  DM type: 1  Dx'ed: 2002  Complications: DR.  Therapy: insulin since dx  DKA: 2010, which pt attributes to a pump malfunction.  Severe hypoglycemia: never Pancreatitis: never Other: pump rx since 2004; he now uses an omnipod pump.  He stopped using his Dexcom G-5, as he did not want to wear and additional device.  He goes to [employer]; therapy limited by missing cbg monitoring and boluses, so the plan has been to emphasize the basal rate.   Interval history:  He takes these settings:  basal rate of 1.6 units/hr, 11 PM-6AM, and 3.6 units/hr all other times.  mealtime bolus (which you take only at supper): 1 unit/ 10 grams carbohydrate (but he seldom takes) correction bolus (which some people call "sensitivity," or "insulin sensitivity ratio," or just "isr") of 1 unit for each 50 by which your glucose exceeds 100.  Suspend the pump x 1-2 hrs, for exercise.   For prolonged (> 2 hrs) exercise, reduce basal rate by 1/2.  Total daily dosage is approx 72 units.   Pt says he takes just 1 bolus per day.  Meter is downloaded today, and the printout is scanned into the record.  There is only 1 cbg. However, he says cbg has been as low as 29 at 1 AM, twice, 1 week apart.  He says cbg is highest at 5-7 PM.   Past Medical History:  Diagnosis Date  . Bilateral carpal tunnel syndrome 01/15/2016  . Paresthesia 12/25/2015  . Type I (juvenile type) diabetes mellitus without mention of complication, uncontrolled     No past surgical history on file.  Social History   Socioeconomic History  . Marital status: Single    Spouse name: Not on file  . Number of children: 0  . Years of education: 35  . Highest education level: Not on file  Occupational History  . Occupation: Restaurant  Tobacco Use  . Smoking status: Never Smoker  . Smokeless tobacco: Never Used    Substance and Sexual Activity  . Alcohol use: No  . Drug use: No  . Sexual activity: Not on file  Other Topics Concern  . Not on file  Social History Narrative   Lives at home w/ mom and sister   Right-handed   Reports 600 mg caffeine per day   Social Determinants of Health   Financial Resource Strain:   . Difficulty of Paying Living Expenses: Not on file  Food Insecurity:   . Worried About Programme researcher, broadcasting/film/video in the Last Year: Not on file  . Ran Out of Food in the Last Year: Not on file  Transportation Needs:   . Lack of Transportation (Medical): Not on file  . Lack of Transportation (Non-Medical): Not on file  Physical Activity:   . Days of Exercise per Week: Not on file  . Minutes of Exercise per Session: Not on file  Stress:   . Feeling of Stress : Not on file  Social Connections:   . Frequency of Communication with Friends and Family: Not on file  . Frequency of Social Gatherings with Friends and Family: Not on file  . Attends Religious Services: Not on file  . Active Member of Clubs or Organizations: Not on file  . Attends Banker Meetings: Not on file  . Marital  Status: Not on file  Intimate Partner Violence:   . Fear of Current or Ex-Partner: Not on file  . Emotionally Abused: Not on file  . Physically Abused: Not on file  . Sexually Abused: Not on file    Current Outpatient Medications on File Prior to Visit  Medication Sig Dispense Refill  . Glucose Blood (BLOOD GLUCOSE TEST STRIPS) STRP Use to check blood sugar 6 times per day. Please verify brand of test strips with pt. 540 each 2  . insulin aspart (NOVOLOG) 100 UNIT/ML injection FOR USE IN PUMP, FOR A TOTAL OF 100 UNITS/DAY. 100 mL 3   Current Facility-Administered Medications on File Prior to Visit  Medication Dose Route Frequency Provider Last Rate Last Admin  . gadopentetate dimeglumine (MAGNEVIST) injection 20 mL  20 mL Intravenous Once PRN Kathrynn Ducking, MD        No Known  Allergies  Family History  Problem Relation Age of Onset  . Hypothyroidism Father   . Hypertension Father   . High Cholesterol Father   . Seizures Father   . Hypothyroidism Mother   . Diabetes Neg Hx     BP 126/70 (BP Location: Right Arm, Patient Position: Sitting, Cuff Size: Large)   Pulse 94   Ht 6' (1.829 m)   Wt 269 lb (122 kg)   SpO2 98%   BMI 36.48 kg/m    Review of Systems Denies LOC    Objective:   Physical Exam VITAL SIGNS:  See vs page GENERAL: no distress Pulses: dorsalis pedis intact bilat.   MSK: no deformity of the feet CV: trace bilat leg edema Skin:  no ulcer on the feet.  normal color and temp on the feet. Neuro: sensation is intact to touch on the feet  Lab Results  Component Value Date   HGBA1C 7.2 (A) 08/19/2019        Assessment & Plan:  Type 1 DM: worse Hypoglycemia: this limits aggressiveness of glycemic control   Patient Instructions  check your blood sugar 6 times a day: before the 3 meals, and at bedtime.  also check if you have symptoms of your blood sugar being too high or too low.  please keep a record of the readings and bring it to your next appointment here (or you can bring the meter itself).  You can write it on any piece of paper.  please call us sooner if your blood sugar goes below 70, or if you have a lot of readings over 200. Please take these settings:  basal rate of 1.6 units/hr, 10 PM-6AM, and 3.7 units/hr all other times.  mealtime bolus (which you take only at supper): 1 unit/ 10 grams carbohydrate.   correction bolus (which some people call "sensitivity," or "insulin sensitivity ratio," or just "isr") of 1 unit for each 50 by which your glucose exceeds 100.  Suspend the pump x 1-2 hrs, for exercise.   For prolonged (> 2 hrs) exercise, reduce basal rate by 1/2.  On this type of insulin schedule, you should eat meals on a regular schedule.  If a meal is missed or significantly delayed, your blood sugar could go low.     Please come back for a follow-up appointment in 3 months.

## 2019-08-20 ENCOUNTER — Other Ambulatory Visit: Payer: Self-pay

## 2019-08-20 DIAGNOSIS — E1065 Type 1 diabetes mellitus with hyperglycemia: Secondary | ICD-10-CM

## 2019-08-20 MED ORDER — GLUCAGON (RDNA) 1 MG IJ KIT: 1.0000 mg | PACK | Freq: Once | INTRAMUSCULAR | 12 refills | Status: AC | PRN

## 2019-08-27 ENCOUNTER — Telehealth: Payer: Self-pay

## 2019-08-27 NOTE — Telephone Encounter (Signed)
FAXED DOCUMENTS  Company: [employer]  Document: CMN for insulin pump Other records requested: None requested  All above requested information has been faxed successfully to [employer] listed above. Documents and fax confirmation have been placed in the faxed file for future reference.

## 2019-09-17 DIAGNOSIS — E109 Type 1 diabetes mellitus without complications: Secondary | ICD-10-CM | POA: Diagnosis not present

## 2019-09-17 DIAGNOSIS — Z9641 Presence of insulin pump (external) (internal): Secondary | ICD-10-CM | POA: Diagnosis not present

## 2019-09-17 DIAGNOSIS — Z794 Long term (current) use of insulin: Secondary | ICD-10-CM | POA: Diagnosis not present

## 2019-10-02 LAB — HM DIABETES EYE EXAM

## 2019-12-02 ENCOUNTER — Ambulatory Visit: Payer: BC Managed Care – PPO | Admitting: Endocrinology

## 2019-12-03 ENCOUNTER — Ambulatory Visit (INDEPENDENT_AMBULATORY_CARE_PROVIDER_SITE_OTHER): Payer: BC Managed Care – PPO | Admitting: Endocrinology

## 2019-12-03 ENCOUNTER — Other Ambulatory Visit: Payer: Self-pay

## 2019-12-03 ENCOUNTER — Encounter: Payer: Self-pay | Admitting: Endocrinology

## 2019-12-03 VITALS — BP 110/78 | HR 87 | Ht 72.0 in | Wt 273.2 lb

## 2019-12-03 DIAGNOSIS — E1065 Type 1 diabetes mellitus with hyperglycemia: Secondary | ICD-10-CM

## 2019-12-03 LAB — POCT GLYCOSYLATED HEMOGLOBIN (HGB A1C): Hemoglobin A1C: 7.6 % — AB (ref 4.0–5.6)

## 2019-12-03 NOTE — Patient Instructions (Addendum)
check your blood sugar 6 times a day: before the 3 meals, and at bedtime.  also check if you have symptoms of your blood sugar being too high or too low.  please keep a record of the readings and bring it to your next appointment here (or you can bring the meter itself).  You can write it on any piece of paper.  please call us sooner if your blood sugar goes below 70, or if you have a lot of readings over 200. Please take these settings:  basal rate of 1.7 units/hr, 2 AM-11AM, and 3.8 units/hr all other times.  mealtime bolus (which you take only at supper): 1 unit/ 10 grams carbohydrate.   correction bolus (which some people call "sensitivity," or "insulin sensitivity ratio," or just "isr") of 1 unit for each 50 by which your glucose exceeds 100.  Suspend the pump x 1-2 hrs, for exercise.   For prolonged (> 2 hrs) exercise, reduce basal rate by 1/2.  On this type of insulin schedule, you should eat meals on a regular schedule.  If a meal is missed or significantly delayed, your blood sugar could go low.   Please come back for a follow-up appointment in 3 months.

## 2019-12-03 NOTE — Progress Notes (Signed)
Subjective:    Patient ID: Sean Fuentes, male    DOB: Sep 26, 1995, 24 y.o.   MRN: [id number]  HPI Pt returns for f/u of diabetes mellitus:  DM type: 1  Dx'ed: 2002  Complications: DR.  Therapy: insulin since dx  DKA: 2010, which pt attributes to a pump malfunction.  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  He stopped using his Dexcom G-5, as he did not want to wear and additional device.  Therapy has been limited by missing cbg monitoring and boluses, so the plan has been to emphasize the basal rate.   Interval history:  He takes these settings:  basal rate of 1.6 units/hr, 10 PM-6AM, and 3.7 units/hr all other times.  mealtime bolus (which you take only at supper): 1 unit/ 10 grams carbohydrate correction bolus (which some people call "sensitivity," or "insulin sensitivity ratio," or just "isr") of 1 unit for each 50 by which your glucose exceeds 100.  Suspend the pump x 1-2 hrs, for exercise.   For prolonged (> 2 hrs) exercise, reduce basal rate by 1/2.  Total daily dosage is approx 70 units.   Pt says he takes just 1 bolus per day.  Meter is downloaded today, and the printout is scanned into the record.  There are only 11 cbgs. cbg varies from 50-300.  There is no trend throughout the day. He has mild hypoglycemia approx twice per week.  This usually happens at 9-10 PM, at work.   Past Medical History:  Diagnosis Date   Bilateral carpal tunnel syndrome 01/15/2016   Paresthesia 12/25/2015   Type I (juvenile type) diabetes mellitus without mention of complication, uncontrolled     No past surgical history on file.  Social History   Socioeconomic History   Marital status: Single    Spouse name: Not on file   Number of children: 0   Years of education: 12   Highest education level: Not on file  Occupational History   Occupation: Restaurant  Tobacco Use   Smoking  status: Never Smoker   Smokeless tobacco: Never Used  Substance and Sexual Activity   Alcohol use: No   Drug use: No   Sexual activity: Not on file  Other Topics Concern   Not on file  Social History Narrative   Lives at home w/ mom and sister   Right-handed   Reports 600 mg caffeine per day   Social Determinants of Health   Financial Resource Strain:    Difficulty of Paying Living Expenses:   Food Insecurity:    Worried About Programme researcher, broadcasting/film/video in the Last Year:    Barista in the Last Year:   Transportation Needs:    Freight forwarder (Medical):    Lack of Transportation (Non-Medical):   Physical Activity:    Days of Exercise per Week:    Minutes of Exercise per Session:   Stress:    Feeling of Stress :   Social Connections:    Frequency of Communication with Friends and Family:    Frequency of Social Gatherings with Friends and Family:    Attends Religious Services:    Active Member of Clubs or Organizations:    Attends Banker Meetings:    Marital Status:   Intimate Partner Violence:    Fear of Current or Ex-Partner:    Emotionally Abused:    Physically Abused:  Sexually Abused:     Current Outpatient Medications on File Prior to Visit  Medication Sig Dispense Refill   glucagon 1 MG injection Inject 1 mg into the muscle once as needed for up to 1 dose (hypoglycemia). E11.9 1 each 12   Glucose Blood (BLOOD GLUCOSE TEST STRIPS) STRP Use to check blood sugar 6 times per day. Please verify brand of test strips with pt. 540 each 2   insulin aspart (NOVOLOG) 100 UNIT/ML injection FOR USE IN PUMP, FOR A TOTAL OF 100 UNITS/DAY. 100 mL 3   Insulin Disposable Pump (OMNIPOD DASH 5 PACK PODS) MISC 1 Device by Does not apply route See admin instructions. Change every 2 1/2 days 8 each 3   Current Facility-Administered Medications on File Prior to Visit  Medication Dose Route Frequency Provider Last Rate Last Admin    gadopentetate dimeglumine (MAGNEVIST) injection 20 mL  20 mL Intravenous Once PRN Kathrynn Ducking, MD        No Known Allergies  Family History  Problem Relation Age of Onset   Hypothyroidism Father    Hypertension Father    High Cholesterol Father    Seizures Father    Hypothyroidism Mother    Diabetes Neg Hx     BP 110/78    Pulse 87    Ht 6' (1.829 m)    Wt 273 lb 3.2 oz (123.9 kg)    SpO2 98%    BMI 37.05 kg/m   Review of Systems Denies LOC    Objective:   Physical Exam VITAL SIGNS:  See vs page GENERAL: no distress Pulses: dorsalis pedis intact bilat.   MSK: no deformity of the feet CV: no leg edema Skin:  no ulcer on the feet.  normal color and temp on the feet. Neuro: sensation is intact to touch on the feet  Lab Results  Component Value Date   HGBA1C 7.6 (A) 12/03/2019        Assessment & Plan:  Type 1 DM, with DR: worse Hypoglycemia, due to insulin: this limits aggressiveness of glycemic control  Patient Instructions  check your blood sugar 6 times a day: before the 3 meals, and at bedtime.  also check if you have symptoms of your blood sugar being too high or too low.  please keep a record of the readings and bring it to your next appointment here (or you can bring the meter itself).  You can write it on any piece of paper.  please call us sooner if your blood sugar goes below 70, or if you have a lot of readings over 200. Please take these settings:  basal rate of 1.7 units/hr, 2 AM-11AM, and 3.8 units/hr all other times.  mealtime bolus (which you take only at supper): 1 unit/ 10 grams carbohydrate.   correction bolus (which some people call "sensitivity," or "insulin sensitivity ratio," or just "isr") of 1 unit for each 50 by which your glucose exceeds 100.  Suspend the pump x 1-2 hrs, for exercise.   For prolonged (> 2 hrs) exercise, reduce basal rate by 1/2.  On this type of insulin schedule, you should eat meals on a regular schedule.  If a  meal is missed or significantly delayed, your blood sugar could go low.   Please come back for a follow-up appointment in 3 months.

## 2020-01-14 DIAGNOSIS — E109 Type 1 diabetes mellitus without complications: Secondary | ICD-10-CM | POA: Diagnosis not present

## 2020-01-14 DIAGNOSIS — Z794 Long term (current) use of insulin: Secondary | ICD-10-CM | POA: Diagnosis not present

## 2020-01-14 DIAGNOSIS — Z9641 Presence of insulin pump (external) (internal): Secondary | ICD-10-CM | POA: Diagnosis not present

## 2020-03-04 ENCOUNTER — Ambulatory Visit: Payer: BC Managed Care – PPO | Admitting: Endocrinology

## 2020-04-08 ENCOUNTER — Ambulatory Visit: Payer: BC Managed Care – PPO | Admitting: Endocrinology

## 2020-04-20 ENCOUNTER — Encounter: Payer: Self-pay | Admitting: Endocrinology

## 2020-04-20 ENCOUNTER — Other Ambulatory Visit: Payer: Self-pay

## 2020-04-20 ENCOUNTER — Ambulatory Visit: Payer: BC Managed Care – PPO | Admitting: Endocrinology

## 2020-04-20 VITALS — BP 128/80 | HR 99 | Ht 72.0 in | Wt 270.4 lb

## 2020-04-20 DIAGNOSIS — Z23 Encounter for immunization: Secondary | ICD-10-CM | POA: Diagnosis not present

## 2020-04-20 DIAGNOSIS — R200 Anesthesia of skin: Secondary | ICD-10-CM | POA: Diagnosis not present

## 2020-04-20 DIAGNOSIS — E1065 Type 1 diabetes mellitus with hyperglycemia: Secondary | ICD-10-CM

## 2020-04-20 LAB — BASIC METABOLIC PANEL
BUN: 11 mg/dL (ref 6–23)
CO2: 28 mEq/L (ref 19–32)
Calcium: 9.6 mg/dL (ref 8.4–10.5)
Chloride: 102 mEq/L (ref 96–112)
Creatinine, Ser: 0.95 mg/dL (ref 0.40–1.50)
GFR: 113.39 mL/min (ref >60.00)
Glucose, Bld: 130 mg/dL — ABNORMAL HIGH (ref 70–99)
Potassium: 3.9 mEq/L (ref 3.5–5.1)
Sodium: 137 mEq/L (ref 135–145)

## 2020-04-20 LAB — POCT GLYCOSYLATED HEMOGLOBIN (HGB A1C): Hemoglobin A1C: 7.8 % — AB (ref 4.0–5.6)

## 2020-04-20 LAB — VITAMIN B12: Vitamin B-12: 497 pg/mL (ref 211–911)

## 2020-04-20 LAB — TSH: TSH: 6.1 u[IU]/mL — ABNORMAL HIGH (ref 0.35–4.50)

## 2020-04-20 LAB — T4, FREE: Free T4: 0.67 ng/dL (ref 0.60–1.60)

## 2020-04-20 MED ORDER — OMNIPOD DASH PODS (GEN 4) MISC: 1.0000 | 3 refills | Status: DC

## 2020-04-20 MED ORDER — GVOKE HYPOPEN 2-PACK 1 MG/0.2ML ~~LOC~~ SOAJ: 1.0000 "pen " | SUBCUTANEOUS | 2 refills | Status: AC | PRN

## 2020-04-20 NOTE — Patient Instructions (Addendum)
check your blood sugar 6 times a day: before the 3 meals, and at bedtime.  also check if you have symptoms of your blood sugar being too high or too low.  please keep a record of the readings and bring it to your next appointment here (or you can bring the meter itself).  You can write it on any piece of paper.  please call us sooner if your blood sugar goes below 70, or if you have a lot of readings over 200. Please take these settings:  basal rate of 1.7 units/hr, 2AM-10AM, and 3.8 units/hr all other times.   mealtime bolus (which you take only at supper): 1 unit/ 10 grams carbohydrate.   correction bolus (which some people call "sensitivity," or "insulin sensitivity ratio," or just "isr") of 1 unit for each 50 by which your glucose exceeds 100.  Suspend the pump x 1-2 hrs, for exercise.   For prolonged (> 2 hrs) exercise, reduce basal rate by 1/2.   On this type of insulin schedule, you should eat meals on a regular schedule.  If a meal is missed or significantly delayed, your blood sugar could go low.   Blood tests are requested for you today.  We'll let you know about the results.  Let's check the circulation.  you will receive a phone call, about a day and time for an appointment.   Please come back for a follow-up appointment in 3 months.

## 2020-04-20 NOTE — Progress Notes (Signed)
Subjective:    Patient ID: Sean Fuentes, male    DOB: Mar 11, 1996, 24 y.o.   MRN: [id number]  HPI Pt returns for f/u of diabetes mellitus:  DM type: 1  Dx'ed: 2002  Complications: DR and PN Therapy: insulin since dx  DKA: 2010, which pt attributes to a pump malfunction.  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   Other: pump rx since 2004; he now uses an omnipod pump.  He stopped using his Dexcom G-5, as he did not want to wear and additional device.  Therapy has been limited by missing cbg monitoring and boluses, so the plan has been to emphasize the basal rate.   Interval history:  He takes these settings:  basal rate of 1.7 units/hr, 2 AM-11AM, and 3.8 units/hr all other times.  mealtime bolus (which you take only at supper): 1 unit/ 10 grams carbohydrate.   correction bolus (which some people call "sensitivity," or "insulin sensitivity ratio," or just "isr") of 1 unit for each 50 by which your glucose exceeds 100.  Suspend the pump x 1-2 hrs, for exercise.   For prolonged (> 2 hrs) exercise, reduce basal rate by 1/2.  Total daily dosage is approx 72 units (98% basal).   Pt says he takes just avg of 0.3 boluses per day.  Meter is downloaded today, and the printout is scanned into the record.  There are only 3 cbgs. cbg varies from 104-353.   He has mild hypoglycemia approx once per day.  This happens when he misses a meal.   Past Medical History:  Diagnosis Date  . Bilateral carpal tunnel syndrome 01/15/2016  . Paresthesia 12/25/2015  . Type I (juvenile type) diabetes mellitus without mention of complication, uncontrolled     No past surgical history on file.  Social History   Socioeconomic History  . Marital status: Single    Spouse name: Not on file  . Number of children: 0  . Years of education: 93  . Highest education level: Not on file  Occupational History  . Occupation: Restaurant  Tobacco Use  . Smoking status: Never  Smoker  . Smokeless tobacco: Never Used  Substance and Sexual Activity  . Alcohol use: No  . Drug use: No  . Sexual activity: Not on file  Other Topics Concern  . Not on file  Social History Narrative   Lives at home w/ mom and sister   Right-handed   Reports 600 mg caffeine per day   Social Determinants of Health   Financial Resource Strain:   . Difficulty of Paying Living Expenses: Not on file  Food Insecurity:   . Worried About Programme researcher, broadcasting/film/video in the Last Year: Not on file  . Ran Out of Food in the Last Year: Not on file  Transportation Needs:   . Lack of Transportation (Medical): Not on file  . Lack of Transportation (Non-Medical): Not on file  Physical Activity:   . Days of Exercise per Week: Not on file  . Minutes of Exercise per Session: Not on file  Stress:   . Feeling of Stress : Not on file  Social Connections:   . Frequency of Communication with Friends and Family: Not on file  . Frequency of Social Gatherings with Friends and Family: Not on file  . Attends Religious Services: Not on file  . Active Member of Clubs or Organizations: Not on file  . Attends Banker Meetings: Not on file  .  Marital Status: Not on file  Intimate Partner Violence:   . Fear of Current or Ex-Partner: Not on file  . Emotionally Abused: Not on file  . Physically Abused: Not on file  . Sexually Abused: Not on file    Current Outpatient Medications on File Prior to Visit  Medication Sig Dispense Refill  . glucagon 1 MG injection Inject 1 mg into the muscle once as needed for up to 1 dose (hypoglycemia). E11.9 1 each 12  . Glucose Blood (BLOOD GLUCOSE TEST STRIPS) STRP Use to check blood sugar 6 times per day. Please verify brand of test strips with pt. 540 each 2  . insulin aspart (NOVOLOG) 100 UNIT/ML injection FOR USE IN PUMP, FOR A TOTAL OF 100 UNITS/DAY. 100 mL 3   Current Facility-Administered Medications on File Prior to Visit  Medication Dose Route Frequency  Provider Last Rate Last Admin  . gadopentetate dimeglumine (MAGNEVIST) injection 20 mL  20 mL Intravenous Once PRN York Spaniel, MD        No Known Allergies  Family History  Problem Relation Age of Onset  . Hypothyroidism Father   . Hypertension Father   . High Cholesterol Father   . Seizures Father   . Hypothyroidism Mother   . Diabetes Neg Hx     BP 128/80   Pulse 99   Ht 6' (1.829 m)   Wt 270 lb 6.4 oz (122.7 kg)   SpO2 97%   BMI 36.67 kg/m    Review of Systems Denies LOC, but he reports numbness of the feet    Objective:   Physical Exam VITAL SIGNS:  See vs page GENERAL: no distress Pulses: dorsalis pedis intact bilat.   MSK: no deformity of the feet CV: no leg edema Skin:  no ulcer on the feet.  normal color and temp on the feet. Neuro: sensation is intact to touch on the feet  Lab Results  Component Value Date   HGBA1C 7.8 (A) 04/20/2020       Assessment & Plan:  Type 1 DM, with DR. Hypoglycemia, due to insulin: this limits aggressiveness of glycemic control   Patient Instructions  check your blood sugar 6 times a day: before the 3 meals, and at bedtime.  also check if you have symptoms of your blood sugar being too high or too low.  please keep a record of the readings and bring it to your next appointment here (or you can bring the meter itself).  You can write it on any piece of paper.  please call us sooner if your blood sugar goes below 70, or if you have a lot of readings over 200. Please take these settings:  basal rate of 1.7 units/hr, 2AM-10AM, and 3.8 units/hr all other times.   mealtime bolus (which you take only at supper): 1 unit/ 10 grams carbohydrate.   correction bolus (which some people call "sensitivity," or "insulin sensitivity ratio," or just "isr") of 1 unit for each 50 by which your glucose exceeds 100.  Suspend the pump x 1-2 hrs, for exercise.   For prolonged (> 2 hrs) exercise, reduce basal rate by 1/2.   On this type of  insulin schedule, you should eat meals on a regular schedule.  If a meal is missed or significantly delayed, your blood sugar could go low.   Blood tests are requested for you today.  We'll let you know about the results.  Let's check the circulation.  you will receive a phone  call, about a day and time for an appointment.   Please come back for a follow-up appointment in 3 months.

## 2020-05-05 DIAGNOSIS — H10012 Acute follicular conjunctivitis, left eye: Secondary | ICD-10-CM | POA: Diagnosis not present

## 2020-05-07 ENCOUNTER — Other Ambulatory Visit: Payer: Self-pay | Admitting: Endocrinology

## 2020-06-08 DIAGNOSIS — Z794 Long term (current) use of insulin: Secondary | ICD-10-CM | POA: Diagnosis not present

## 2020-06-08 DIAGNOSIS — Z9641 Presence of insulin pump (external) (internal): Secondary | ICD-10-CM | POA: Diagnosis not present

## 2020-06-08 DIAGNOSIS — E109 Type 1 diabetes mellitus without complications: Secondary | ICD-10-CM | POA: Diagnosis not present

## 2020-07-27 ENCOUNTER — Ambulatory Visit: Payer: BC Managed Care – PPO | Admitting: Endocrinology

## 2020-10-30 DIAGNOSIS — E109 Type 1 diabetes mellitus without complications: Secondary | ICD-10-CM | POA: Diagnosis not present

## 2020-10-30 DIAGNOSIS — Z794 Long term (current) use of insulin: Secondary | ICD-10-CM | POA: Diagnosis not present

## 2020-10-30 DIAGNOSIS — Z9641 Presence of insulin pump (external) (internal): Secondary | ICD-10-CM | POA: Diagnosis not present

## 2020-12-09 ENCOUNTER — Other Ambulatory Visit: Payer: Self-pay

## 2020-12-09 ENCOUNTER — Ambulatory Visit: Payer: BC Managed Care – PPO | Admitting: Endocrinology

## 2020-12-09 VITALS — BP 116/68 | Ht 72.0 in | Wt 267.0 lb

## 2020-12-09 DIAGNOSIS — E1065 Type 1 diabetes mellitus with hyperglycemia: Secondary | ICD-10-CM

## 2020-12-09 LAB — POCT GLYCOSYLATED HEMOGLOBIN (HGB A1C): Hemoglobin A1C: 6.4 % — AB (ref 4.0–5.6)

## 2020-12-09 MED ORDER — OMNIPOD DASH PODS (GEN 4) MISC: 1.0000 | 3 refills | Status: AC

## 2020-12-09 NOTE — Patient Instructions (Addendum)
check your blood sugar 6 times a day: before the 3 meals, and at bedtime.  also check if you have symptoms of your blood sugar being too high or too low.  please keep a record of the readings and bring it to your next appointment here (or you can bring the meter itself).  You can write it on any piece of paper.  please call us sooner if your blood sugar goes below 70, or if you have a lot of readings over 200. Please take these settings:  basal rate of 1.4 units/hr, 2AM-10AM, and 3. units/hr all other times.   mealtime bolus (which you take only at supper): 1 unit/ 10 grams carbohydrate.   correction bolus (which some people call "sensitivity," or "insulin sensitivity ratio," or just "isr") of 1 unit for each 50 by which your glucose exceeds 100.  Suspend the pump x 1-2 hrs, for exercise.   For prolonged (> 2 hrs) exercise, reduce basal rate by 1/2.   On this type of insulin schedule, you should eat meals on a regular schedule.  If a meal is missed or significantly delayed, your blood sugar could go low.   Blood tests are requested for you today.  We'll let you know about the results.  Please come back for a follow-up appointment in 2-3 months.

## 2020-12-09 NOTE — Progress Notes (Signed)
Subjective:    Patient ID: Sean Fuentes, male    DOB: 05-Mar-1996, 25 y.o.   MRN: [id number]  HPI Pt returns for f/u of diabetes mellitus:  DM type: 1  Dx'ed: 2002  Complications: DR and PN Therapy: insulin since dx  DKA: 2010, which pt attributes to a pump malfunction.  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   Other: pump rx since 2004; he now uses an omnipod pump.  He stopped using his Dexcom G-5, as he did not want to wear and additional device.  Therapy has been limited by missing cbg monitoring and boluses, so the plan has been to emphasize the basal rate.   Interval history:  He takes these settings:  basal rate of 1.5 units/hr, 2AM-10AM, and 3.5 units/hr all other times.   mealtime bolus (which you take only at supper): 1 unit/10 grams carbohydrate.   correction bolus (which some people call "sensitivity," or "insulin sensitivity ratio," or just "isr") of 1 unit for each 50 by which your glucose exceeds 100.  Suspend the pump x 1-2 hrs, for exercise.   For prolonged (> 2 hrs) exercise, reduce basal rate by 1/2.  We are unable to download pump and continuous glucose monitor today.  pt states he feels well in general. He has mild hypoglycemia approx QOD.  This happens when a meal is missed.   He reduced insulin, due to increased exercise.  He works 3rd shift, in Office manager.    Past Medical History:  Diagnosis Date   Bilateral carpal tunnel syndrome 01/15/2016   Paresthesia 12/25/2015   Type I (juvenile type) diabetes mellitus without mention of complication, uncontrolled     No past surgical history on file.  Social History   Socioeconomic History   Marital status: Single    Spouse name: Not on file   Number of children: 0   Years of education: 12   Highest education level: Not on file  Occupational History   Occupation: Restaurant  Tobacco Use   Smoking status: Never   Smokeless tobacco: Never  Substance and Sexual Activity    Alcohol use: No   Drug use: No   Sexual activity: Not on file  Other Topics Concern   Not on file  Social History Narrative   Lives at home w/ mom and sister   Right-handed   Reports 600 mg caffeine per day   Social Determinants of Health   Financial Resource Strain: Not on file  Food Insecurity: Not on file  Transportation Needs: Not on file  Physical Activity: Not on file  Stress: Not on file  Social Connections: Not on file  Intimate Partner Violence: Not on file    Current Outpatient Medications on File Prior to Visit  Medication Sig Dispense Refill   glucagon 1 MG injection Inject 1 mg into the muscle once as needed for up to 1 dose (hypoglycemia). E11.9 1 each 12   Glucose Blood (BLOOD GLUCOSE TEST STRIPS) STRP Use to check blood sugar 6 times per day. Please verify brand of test strips with pt. 540 each 2   GVOKE HYPOPEN 2-PACK 1 MG/0.2ML SOAJ Inject 1 pen into the skin as needed. 0.8 mL 2   insulin aspart (NOVOLOG) 100 UNIT/ML injection FOR USE IN PUMP, FOR A TOTAL OF 100 UNITS/DAY. 100 mL 3   Current Facility-Administered Medications on File Prior to Visit  Medication Dose Route Frequency Provider Last Rate Last Admin   gadopentetate dimeglumine (MAGNEVIST) injection 20  mL  20 mL Intravenous Once PRN York Spaniel, MD        No Known Allergies  Family History  Problem Relation Age of Onset   Hypothyroidism Father    Hypertension Father    High Cholesterol Father    Seizures Father    Hypothyroidism Mother    Diabetes Neg Hx     BP 116/68   Ht 6' (1.829 m)   Wt 267 lb (121.1 kg)   BMI 36.21 kg/m   Review of Systems Denies LOC    Objective:   Physical Exam   Lab Results  Component Value Date   HGBA1C 6.4 (A) 12/09/2020       Assessment & Plan:  Type 1 DM.  Hypoglycemia, due to insulin: this limits aggressiveness of glycemic control.    Patient Instructions  check your blood sugar 6 times a day: before the 3 meals, and at bedtime.   also check if you have symptoms of your blood sugar being too high or too low.  please keep a record of the readings and bring it to your next appointment here (or you can bring the meter itself).  You can write it on any piece of paper.  please call us sooner if your blood sugar goes below 70, or if you have a lot of readings over 200. Please take these settings:  basal rate of 1.4 units/hr, 2AM-10AM, and 3. units/hr all other times.   mealtime bolus (which you take only at supper): 1 unit/ 10 grams carbohydrate.   correction bolus (which some people call "sensitivity," or "insulin sensitivity ratio," or just "isr") of 1 unit for each 50 by which your glucose exceeds 100.  Suspend the pump x 1-2 hrs, for exercise.   For prolonged (> 2 hrs) exercise, reduce basal rate by 1/2.   On this type of insulin schedule, you should eat meals on a regular schedule.  If a meal is missed or significantly delayed, your blood sugar could go low.   Blood tests are requested for you today.  We'll let you know about the results.  Please come back for a follow-up appointment in 2-3 months.

## 2020-12-16 ENCOUNTER — Telehealth: Payer: Self-pay | Admitting: Pharmacy Technician

## 2020-12-16 NOTE — Telephone Encounter (Signed)
[facility] Patient Advocate Encounter  Prior Authorization for OMNIPOD DASH PODS GEN 4 has been approved.    PA# [id number] Effective dates: 12/16/2020 through 12/15/2021.     [facility] will continue to follow.   Netty Starring. Dimas Aguas, CPhT Patient Advocate [facility] Phone: [phone] Fax:  [phone]

## 2021-02-09 ENCOUNTER — Emergency Department (HOSPITAL_BASED_OUTPATIENT_CLINIC_OR_DEPARTMENT_OTHER)
Admission: EM | Admit: 2021-02-09 | Discharge: 2021-02-09 | Disposition: A | Discharge status: Home / Self Care | Payer: BC Managed Care – PPO | Attending: Emergency Medicine | Admitting: Emergency Medicine

## 2021-02-09 ENCOUNTER — Encounter (HOSPITAL_BASED_OUTPATIENT_CLINIC_OR_DEPARTMENT_OTHER): Payer: Self-pay

## 2021-02-09 ENCOUNTER — Other Ambulatory Visit: Payer: Self-pay

## 2021-02-09 ENCOUNTER — Emergency Department (HOSPITAL_BASED_OUTPATIENT_CLINIC_OR_DEPARTMENT_OTHER): Payer: BC Managed Care – PPO

## 2021-02-09 DIAGNOSIS — S0990XA Unspecified injury of head, initial encounter: Secondary | ICD-10-CM | POA: Diagnosis not present

## 2021-02-09 DIAGNOSIS — S0993XA Unspecified injury of face, initial encounter: Secondary | ICD-10-CM | POA: Diagnosis not present

## 2021-02-09 DIAGNOSIS — Z794 Long term (current) use of insulin: Secondary | ICD-10-CM | POA: Diagnosis not present

## 2021-02-09 DIAGNOSIS — E039 Hypothyroidism, unspecified: Secondary | ICD-10-CM | POA: Insufficient documentation

## 2021-02-09 DIAGNOSIS — S0992XA Unspecified injury of nose, initial encounter: Secondary | ICD-10-CM | POA: Diagnosis not present

## 2021-02-09 DIAGNOSIS — E109 Type 1 diabetes mellitus without complications: Secondary | ICD-10-CM | POA: Diagnosis not present

## 2021-02-09 DIAGNOSIS — R519 Headache, unspecified: Secondary | ICD-10-CM | POA: Diagnosis not present
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
## 2021-02-09 NOTE — Discharge Instructions (Addendum)
CT scan is negative for injury today.  Follow-up with your primary care provider if symptoms persist.
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
## 2021-03-06 DIAGNOSIS — R0602 Shortness of breath: Secondary | ICD-10-CM | POA: Diagnosis not present

## 2021-03-06 DIAGNOSIS — E109 Type 1 diabetes mellitus without complications: Secondary | ICD-10-CM | POA: Diagnosis not present

## 2021-03-06 DIAGNOSIS — Z6838 Body mass index (BMI) 38.0-38.9, adult: Secondary | ICD-10-CM | POA: Diagnosis not present

## 2021-03-06 DIAGNOSIS — J45990 Exercise induced bronchospasm: Secondary | ICD-10-CM | POA: Diagnosis not present

## 2021-03-06 DIAGNOSIS — J4520 Mild intermittent asthma, uncomplicated: Secondary | ICD-10-CM | POA: Diagnosis not present

## 2021-03-08 DIAGNOSIS — Z9641 Presence of insulin pump (external) (internal): Secondary | ICD-10-CM | POA: Diagnosis not present

## 2021-03-08 DIAGNOSIS — E109 Type 1 diabetes mellitus without complications: Secondary | ICD-10-CM | POA: Diagnosis not present

## 2021-03-08 DIAGNOSIS — Z794 Long term (current) use of insulin: Secondary | ICD-10-CM | POA: Diagnosis not present

## 2021-04-03 DIAGNOSIS — Z6837 Body mass index (BMI) 37.0-37.9, adult: Secondary | ICD-10-CM | POA: Diagnosis not present

## 2021-04-03 DIAGNOSIS — E109 Type 1 diabetes mellitus without complications: Secondary | ICD-10-CM | POA: Diagnosis not present

## 2021-04-03 DIAGNOSIS — J45990 Exercise induced bronchospasm: Secondary | ICD-10-CM | POA: Diagnosis not present

## 2021-04-03 DIAGNOSIS — J4520 Mild intermittent asthma, uncomplicated: Secondary | ICD-10-CM | POA: Diagnosis not present

## 2021-04-03 DIAGNOSIS — J302 Other seasonal allergic rhinitis: Secondary | ICD-10-CM | POA: Diagnosis not present

## 2021-04-13 DIAGNOSIS — E109 Type 1 diabetes mellitus without complications: Secondary | ICD-10-CM | POA: Diagnosis not present

## 2021-04-13 DIAGNOSIS — J4520 Mild intermittent asthma, uncomplicated: Secondary | ICD-10-CM | POA: Diagnosis not present

## 2021-04-13 DIAGNOSIS — Z008 Encounter for other general examination: Secondary | ICD-10-CM | POA: Diagnosis not present

## 2021-04-13 DIAGNOSIS — J45990 Exercise induced bronchospasm: Secondary | ICD-10-CM | POA: Diagnosis not present

## 2021-04-13 DIAGNOSIS — R0602 Shortness of breath: Secondary | ICD-10-CM | POA: Diagnosis not present

## 2021-04-13 DIAGNOSIS — Z6837 Body mass index (BMI) 37.0-37.9, adult: Secondary | ICD-10-CM | POA: Diagnosis not present

## 2021-05-01 LAB — HM DIABETES EYE EXAM

## 2021-05-14 DIAGNOSIS — M25521 Pain in right elbow: Secondary | ICD-10-CM | POA: Diagnosis not present

## 2021-05-14 DIAGNOSIS — M25511 Pain in right shoulder: Secondary | ICD-10-CM | POA: Diagnosis not present

## 2021-05-19 ENCOUNTER — Encounter: Payer: Self-pay | Admitting: Endocrinology

## 2021-06-23 ENCOUNTER — Other Ambulatory Visit: Payer: Self-pay | Admitting: Endocrinology

## 2023-06-26 IMAGING — CT CT HEAD W/O CM
3 series · 14 of 47 positions shown, 16 images · non-contrast
Comparison: Head CT dated [DATE].

CLINICAL DATA: Trauma.

EXAM:
CT HEAD WITHOUT CONTRAST
CT MAXILLOFACIAL WITHOUT CONTRAST
TECHNIQUE: Multidetector CT imaging of the head and maxillofacial structures
were performed using the standard protocol without intravenous
contrast. Multiplanar CT image reconstructions of the maxillofacial
structures were also generated.

[Series 2: head wo · axial · 0.49mm/px · z∈[-399,-254]mm · 8 of 35 slices shown, 10 images]
[im 3/35  brain]
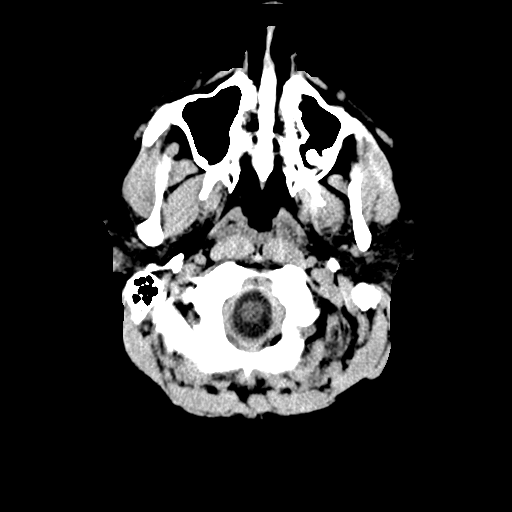
[im 3/35  bone]
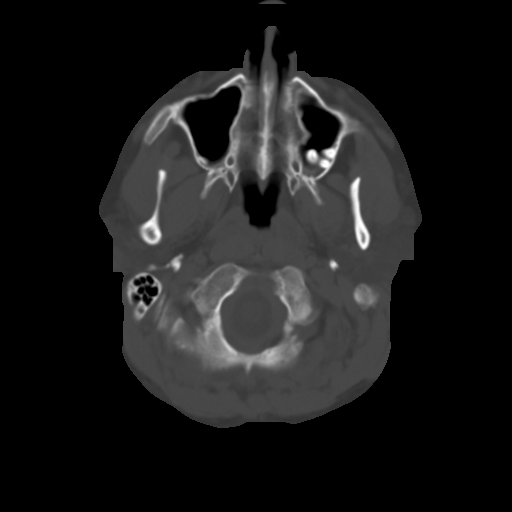
[im 8/35  brain]
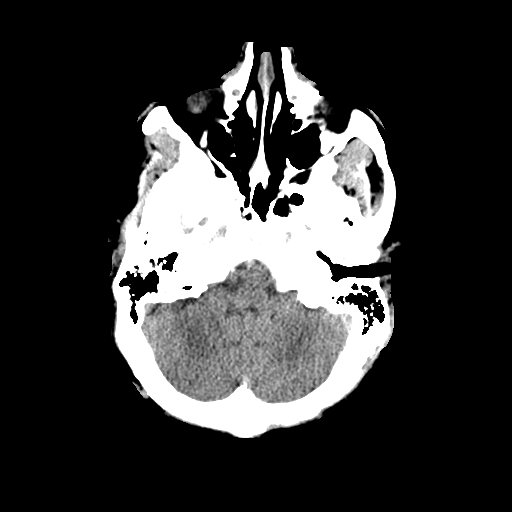
[im 11/35  brain]
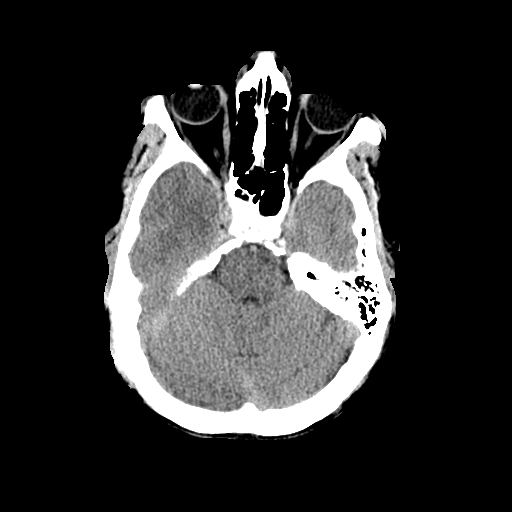
[im 16/35  brain]
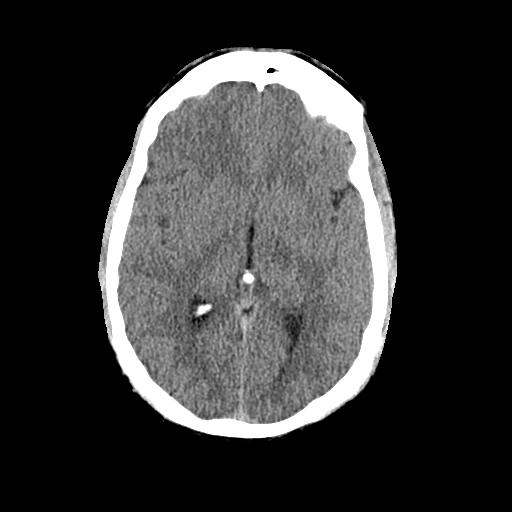
[im 19/35  brain]
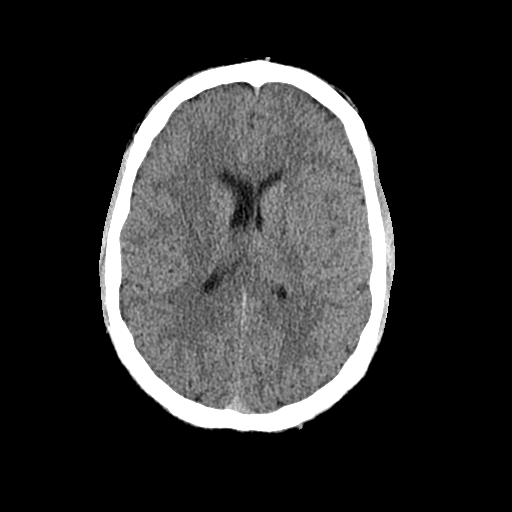
[im 19/35  bone]
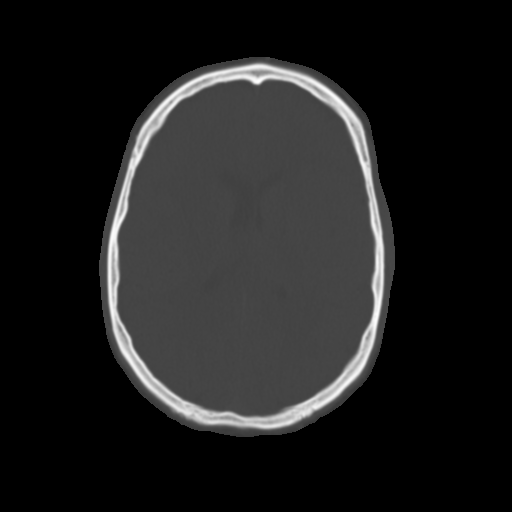
[im 24/35  brain]
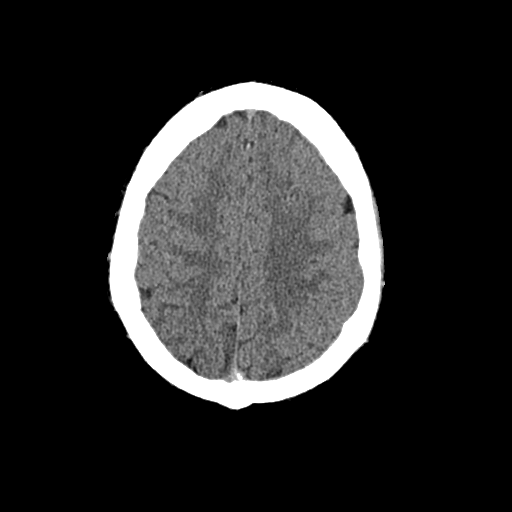
[im 27/35  brain]
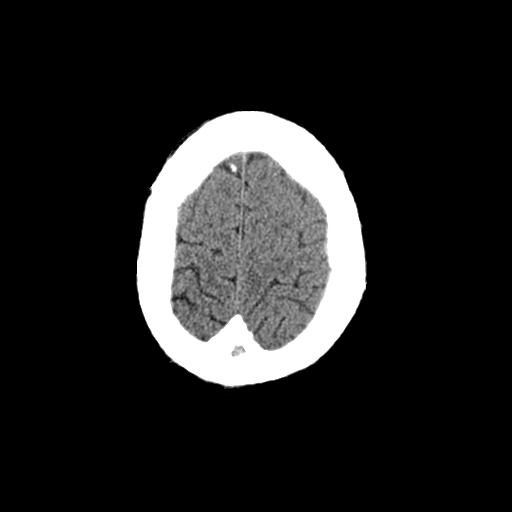
[im 32/35  brain]
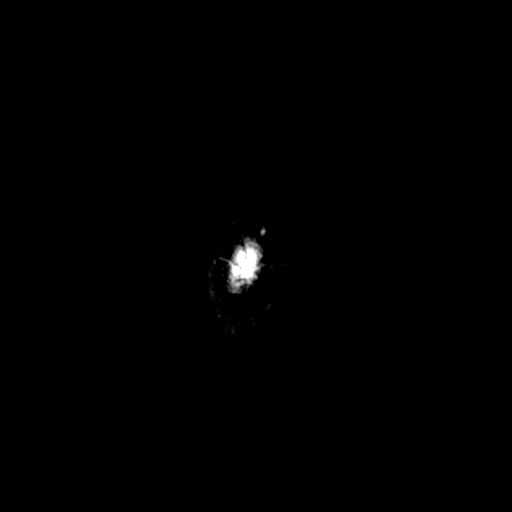

[Series 4: cor head wo · coronal · 0.34mm/px · 3 of 84 slices shown]
[im 28/84  brain]
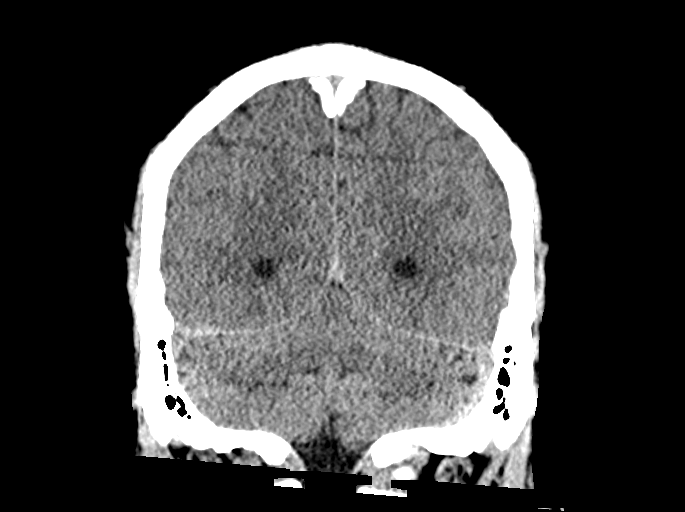
[im 37/84  brain]
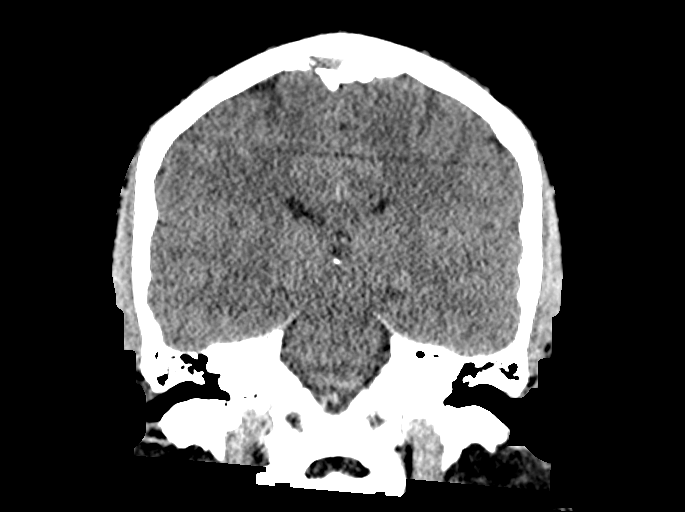
[im 47/84  brain]
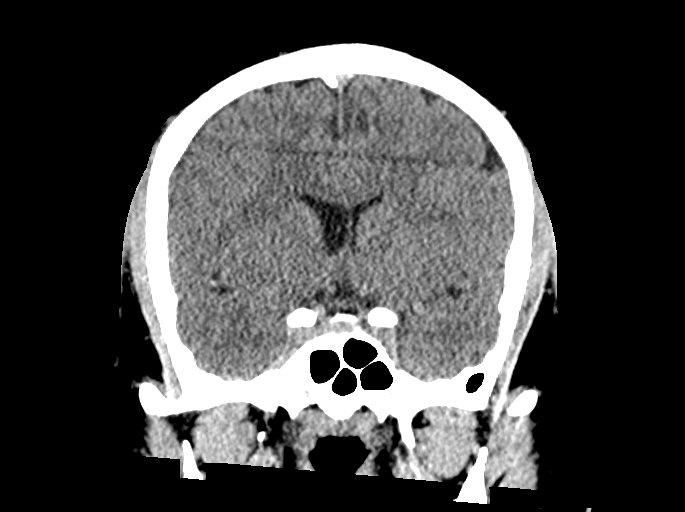

[Series 5: sag head wo · sagittal · 0.34mm/px · 3 of 71 slices shown]
[im 24/71  brain]
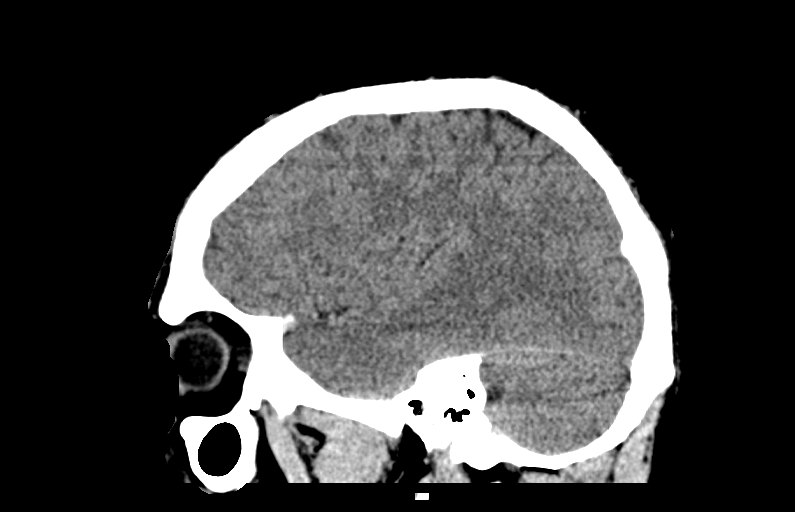
[im 36/71  brain]
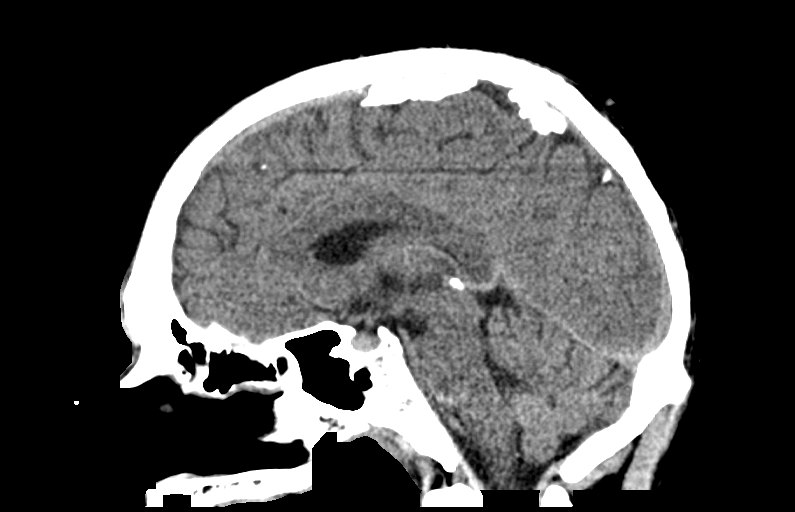
[im 47/71  brain]
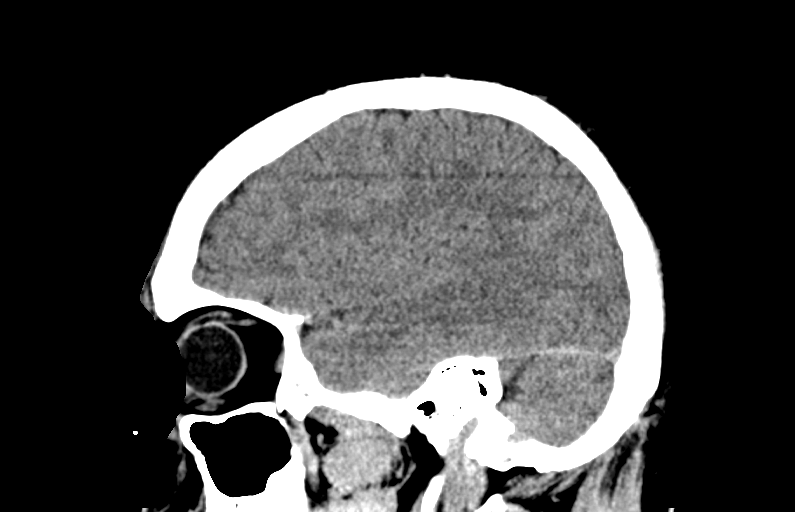

[14 of 47 positions shown; findings below may reference images not displayed]

FINDINGS: CT HEAD FINDINGS

Brain: The ventricles and sulci appropriate size for patient's age.
The gray-white matter discrimination is preserved. There is no acute
intracranial hemorrhage. No mass effect or midline shift. No
extra-axial fluid collection.

Vascular: No hyperdense vessel or unexpected calcification.

Skull: Normal. Negative for fracture or focal lesion.

Other: None.

CT MAXILLOFACIAL FINDINGS

Osseous: No fracture or mandibular dislocation. No destructive
process.

Orbits: Negative. No traumatic or inflammatory finding.

Sinuses: Clear.

Soft tissues: None
IMPRESSION: 1. Normal noncontrast CT of the brain.
2. No acute facial bone fractures.

## 2023-06-26 IMAGING — CT CT MAXILLOFACIAL W/O CM
3 of 4 series · 16 of 47 positions shown, 19 images · non-contrast
Comparison: Head CT dated [DATE].

CLINICAL DATA: Trauma.

EXAM:
CT HEAD WITHOUT CONTRAST
CT MAXILLOFACIAL WITHOUT CONTRAST
TECHNIQUE: Multidetector CT imaging of the head and maxillofacial structures
were performed using the standard protocol without intravenous
contrast. Multiplanar CT image reconstructions of the maxillofacial
structures were also generated.

[Series 2: max soft · axial · 0.42mm/px · z∈[-487,-325]mm · 11 of 95 slices shown, 14 images]
[im 7/95  brain]
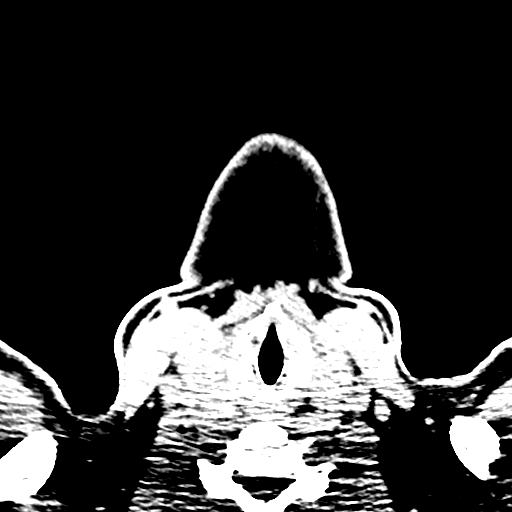
[im 7/95  bone]
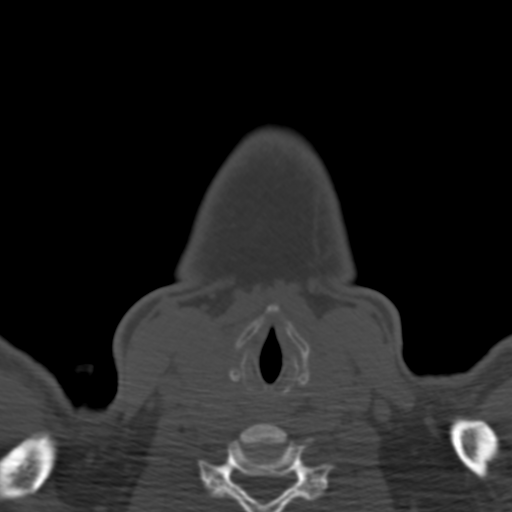
[im 13/95  bone]
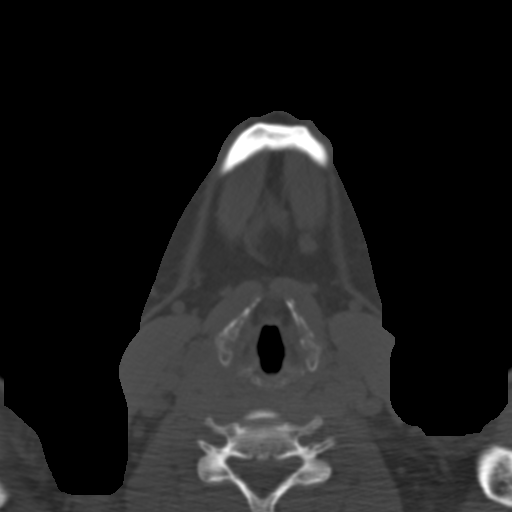
[im 23/95  bone]
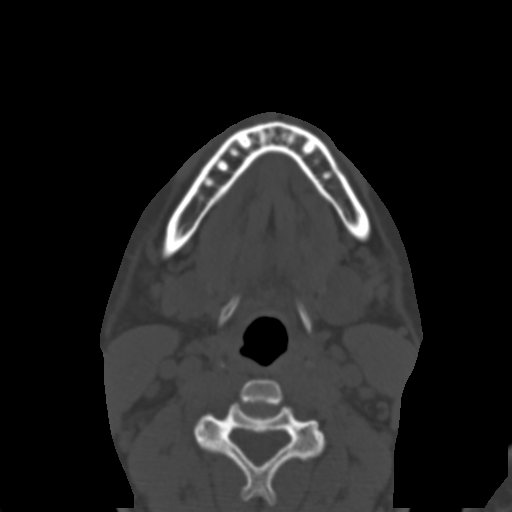
[im 30/95  bone]
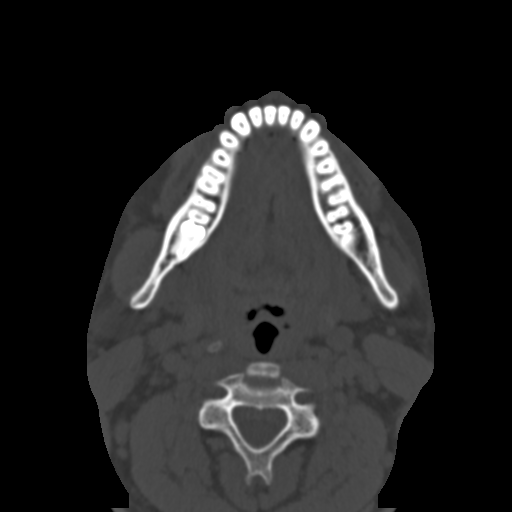
[im 39/95  brain]
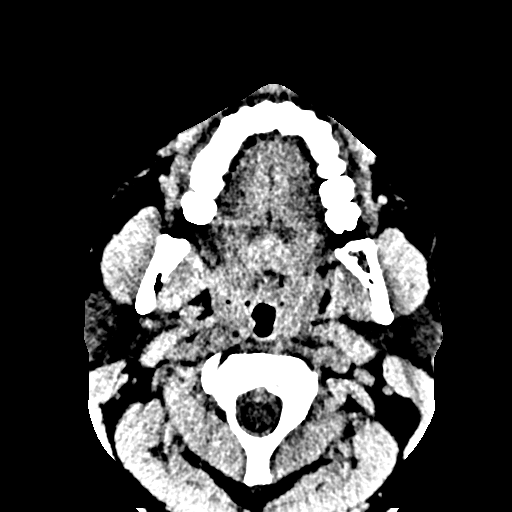
[im 39/95  bone]
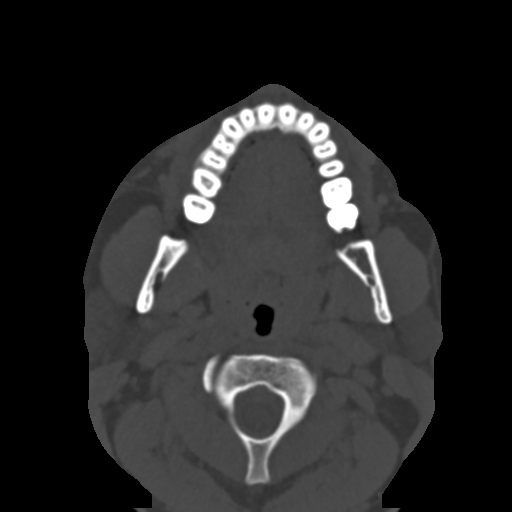
[im 49/95  bone]
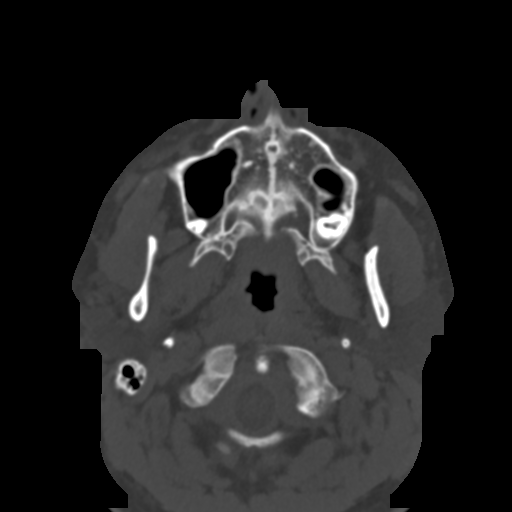
[im 56/95  bone]
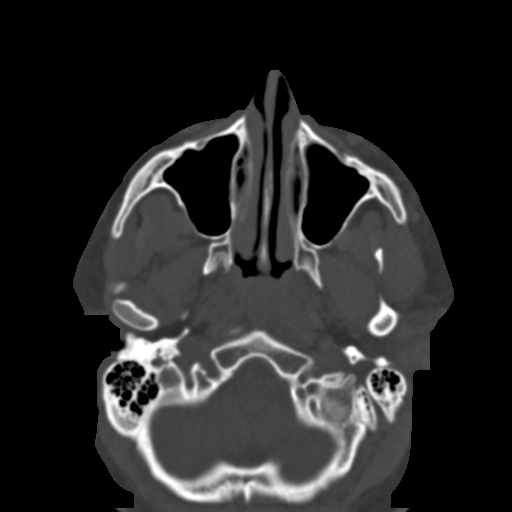
[im 65/95  bone]
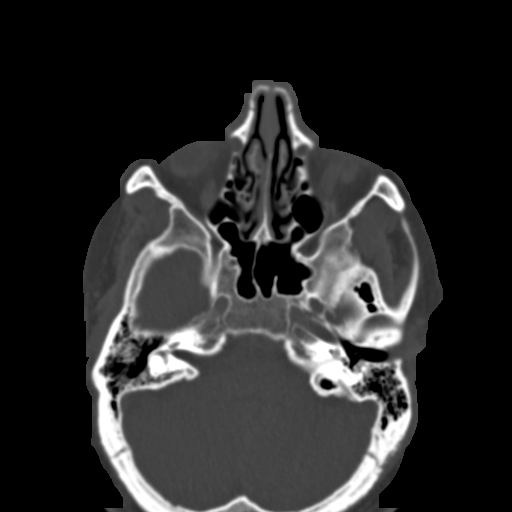
[im 72/95  brain]
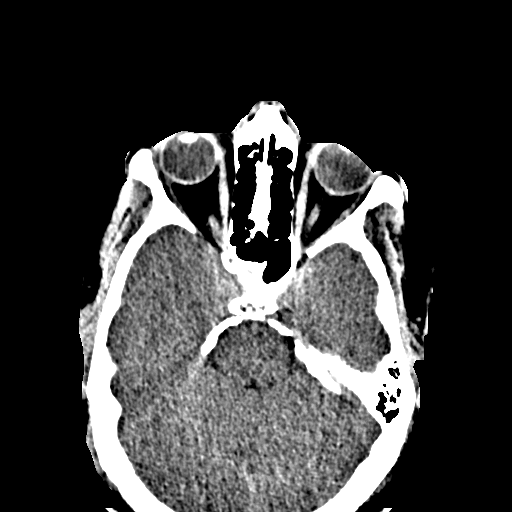
[im 72/95  bone]
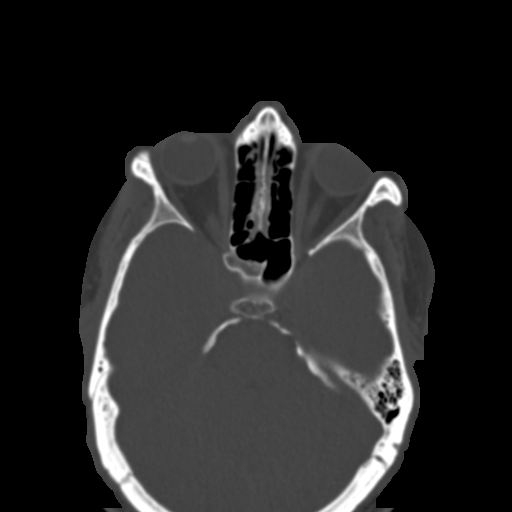
[im 82/95  bone]
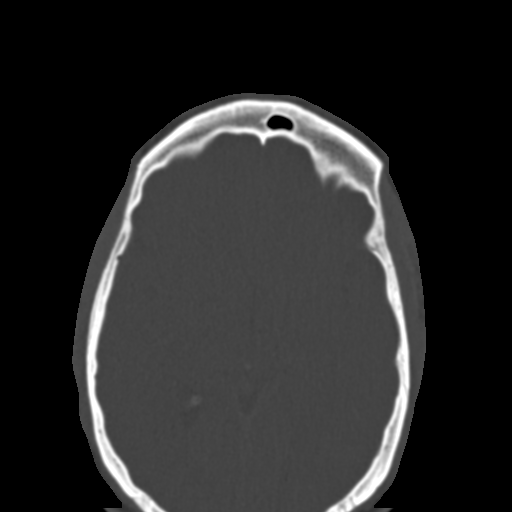
[im 88/95  bone]
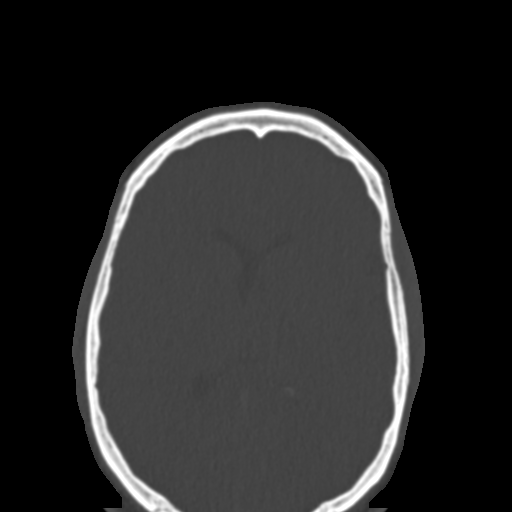

[Series 6: coronal soft · coronal · 0.43mm/px · 3 of 96 slices shown]
[im 32/96  bone]
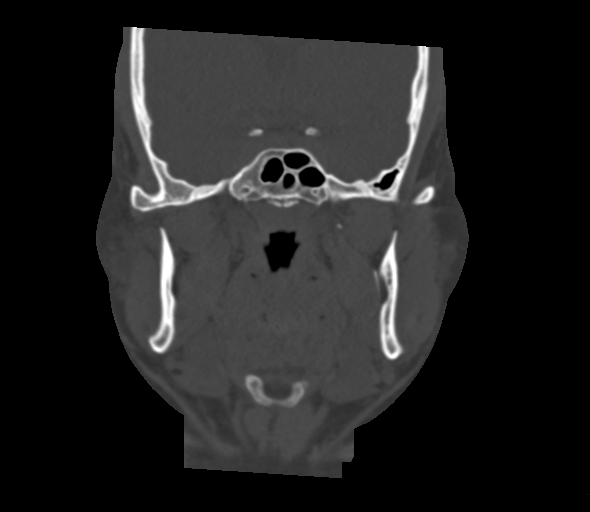
[im 43/96  bone]
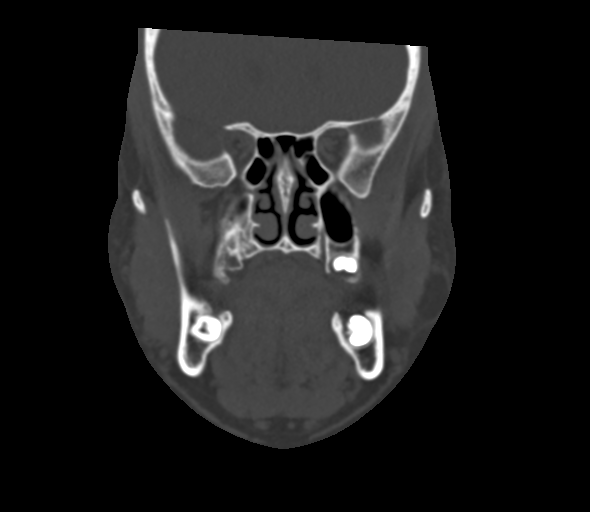
[im 53/96  bone]
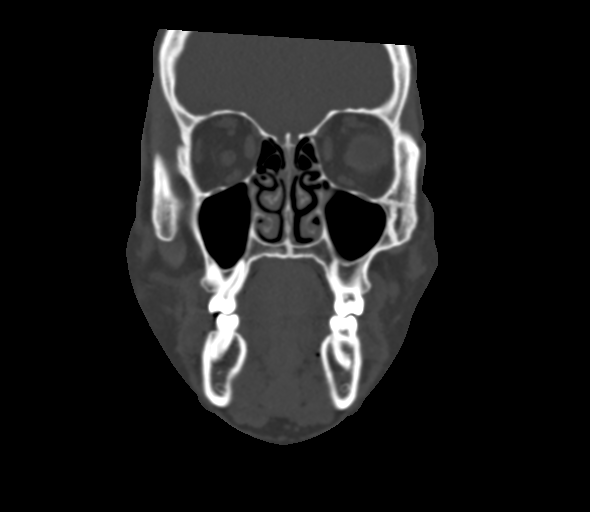

[Series 9: sagittal bone · sagittal · 0.39mm/px · 2 of 116 slices shown]
[im 39/116  bone]
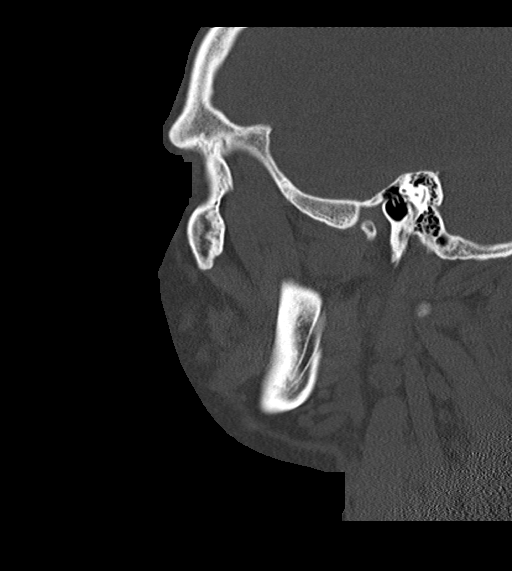
[im 77/116  bone]
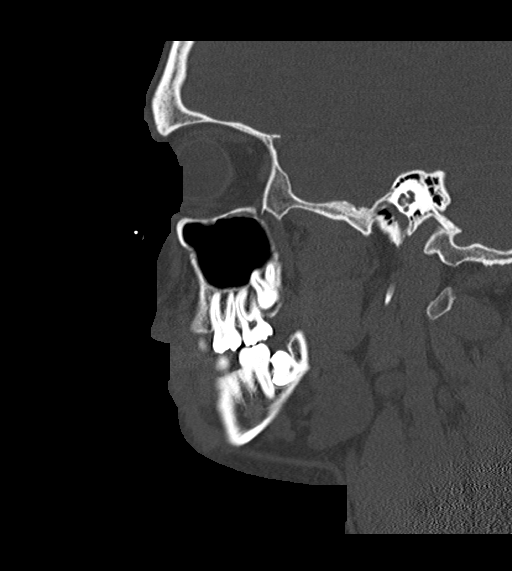

[16 of 47 positions shown; findings below may reference images not displayed]

FINDINGS: CT HEAD FINDINGS

Brain: The ventricles and sulci appropriate size for patient's age.
The gray-white matter discrimination is preserved. There is no acute
intracranial hemorrhage. No mass effect or midline shift. No
extra-axial fluid collection.

Vascular: No hyperdense vessel or unexpected calcification.

Skull: Normal. Negative for fracture or focal lesion.

Other: None.

CT MAXILLOFACIAL FINDINGS

Osseous: No fracture or mandibular dislocation. No destructive
process.

Orbits: Negative. No traumatic or inflammatory finding.

Sinuses: Clear.

Soft tissues: None
IMPRESSION: 1. Normal noncontrast CT of the brain.
2. No acute facial bone fractures.
